# Patient Record
Sex: Male | Born: 1960 | Race: White | Hispanic: No | Marital: Single | Smoking: Former smoker
Health system: Southern US, Community
[De-identification: ages and names within clinical notes are randomized; demographics above are authoritative.]

## PROBLEM LIST (undated history)

## (undated) DIAGNOSIS — T8859XA Other complications of anesthesia, initial encounter: Secondary | ICD-10-CM

## (undated) DIAGNOSIS — Z9889 Other specified postprocedural states: Secondary | ICD-10-CM

## (undated) DIAGNOSIS — N200 Calculus of kidney: Secondary | ICD-10-CM

## (undated) DIAGNOSIS — Z87442 Personal history of urinary calculi: Secondary | ICD-10-CM

## (undated) DIAGNOSIS — J189 Pneumonia, unspecified organism: Secondary | ICD-10-CM

## (undated) DIAGNOSIS — C801 Malignant (primary) neoplasm, unspecified: Secondary | ICD-10-CM

## (undated) HISTORY — PX: TENDON REPAIR: SHX5111

## (undated) HISTORY — PX: COLONOSCOPY W/ POLYPECTOMY: SHX1380

## (undated) HISTORY — PX: CYST EXCISION: SHX5701

## (undated) HISTORY — DX: Calculus of kidney: N20.0

## (undated) HISTORY — PX: VASECTOMY: SHX75

---

## 2007-04-13 ENCOUNTER — Ambulatory Visit: Payer: Self-pay | Admitting: Family Medicine

## 2007-05-19 ENCOUNTER — Ambulatory Visit: Payer: Self-pay | Admitting: General Surgery

## 2010-01-16 ENCOUNTER — Ambulatory Visit: Payer: Self-pay | Admitting: Family Medicine

## 2013-01-19 DIAGNOSIS — E7800 Pure hypercholesterolemia, unspecified: Secondary | ICD-10-CM | POA: Insufficient documentation

## 2013-01-19 DIAGNOSIS — Z8249 Family history of ischemic heart disease and other diseases of the circulatory system: Secondary | ICD-10-CM | POA: Insufficient documentation

## 2013-01-19 DIAGNOSIS — Z8042 Family history of malignant neoplasm of prostate: Secondary | ICD-10-CM | POA: Insufficient documentation

## 2013-01-19 DIAGNOSIS — L409 Psoriasis, unspecified: Secondary | ICD-10-CM | POA: Insufficient documentation

## 2014-01-19 DIAGNOSIS — N400 Enlarged prostate without lower urinary tract symptoms: Secondary | ICD-10-CM | POA: Insufficient documentation

## 2015-04-04 ENCOUNTER — Other Ambulatory Visit (HOSPITAL_COMMUNITY): Payer: Self-pay | Admitting: Physical Medicine and Rehabilitation

## 2015-04-04 DIAGNOSIS — M545 Low back pain: Secondary | ICD-10-CM

## 2015-04-04 DIAGNOSIS — M5417 Radiculopathy, lumbosacral region: Secondary | ICD-10-CM

## 2015-04-04 DIAGNOSIS — M5116 Intervertebral disc disorders with radiculopathy, lumbar region: Secondary | ICD-10-CM

## 2015-04-11 ENCOUNTER — Ambulatory Visit
Admission: RE | Admit: 2015-04-11 | Discharge: 2015-04-11 | Disposition: A | Discharge status: Home / Self Care | Source: Ambulatory Visit | Attending: Physical Medicine and Rehabilitation | Admitting: Physical Medicine and Rehabilitation

## 2015-04-11 ENCOUNTER — Other Ambulatory Visit: Payer: Self-pay | Admitting: Physical Medicine and Rehabilitation

## 2015-04-11 ENCOUNTER — Ambulatory Visit (HOSPITAL_COMMUNITY): Payer: Self-pay

## 2015-04-11 DIAGNOSIS — M5126 Other intervertebral disc displacement, lumbar region: Secondary | ICD-10-CM | POA: Insufficient documentation

## 2015-04-11 DIAGNOSIS — M5116 Intervertebral disc disorders with radiculopathy, lumbar region: Secondary | ICD-10-CM

## 2015-04-11 DIAGNOSIS — M5417 Radiculopathy, lumbosacral region: Secondary | ICD-10-CM

## 2015-04-11 DIAGNOSIS — M545 Low back pain: Secondary | ICD-10-CM

## 2015-04-11 DIAGNOSIS — M2578 Osteophyte, vertebrae: Secondary | ICD-10-CM | POA: Insufficient documentation

## 2015-04-11 DIAGNOSIS — M5416 Radiculopathy, lumbar region: Secondary | ICD-10-CM | POA: Diagnosis present

## 2017-03-12 DIAGNOSIS — G8929 Other chronic pain: Secondary | ICD-10-CM | POA: Insufficient documentation

## 2017-07-14 DIAGNOSIS — Z87442 Personal history of urinary calculi: Secondary | ICD-10-CM | POA: Insufficient documentation

## 2017-08-01 DIAGNOSIS — E538 Deficiency of other specified B group vitamins: Secondary | ICD-10-CM | POA: Insufficient documentation

## 2018-05-19 ENCOUNTER — Encounter: Payer: Self-pay | Admitting: Urology

## 2018-05-19 ENCOUNTER — Ambulatory Visit (INDEPENDENT_AMBULATORY_CARE_PROVIDER_SITE_OTHER): Admitting: Urology

## 2018-05-19 VITALS — BP 103/62 | HR 87 | Ht 67.75 in | Wt 165.8 lb

## 2018-05-19 DIAGNOSIS — N200 Calculus of kidney: Secondary | ICD-10-CM | POA: Diagnosis not present

## 2018-05-19 DIAGNOSIS — R1012 Left upper quadrant pain: Secondary | ICD-10-CM | POA: Diagnosis not present

## 2018-05-19 LAB — URINALYSIS, COMPLETE
BILIRUBIN UA: NEGATIVE
Glucose, UA: NEGATIVE
Ketones, UA: NEGATIVE
Leukocytes, UA: NEGATIVE
Nitrite, UA: NEGATIVE
PH UA: 5.5 (ref 5.0–7.5)
Protein, UA: NEGATIVE
RBC, UA: NEGATIVE
Specific Gravity, UA: 1.025 (ref 1.005–1.030)
UUROB: 0.2 mg/dL (ref 0.2–1.0)

## 2018-05-19 LAB — MICROSCOPIC EXAMINATION
EPITHELIAL CELLS (NON RENAL): NONE SEEN /HPF (ref 0–10)
WBC, UA: NONE SEEN /hpf (ref 0–5)

## 2018-05-19 NOTE — Progress Notes (Addendum)
   05/19/2018 12:18 PM   Gerald Chambers [birth year] [id number]  Referring provider: Gayland Curry, MD [address] [place], [place] [address]  CC: Kidney stones  HPI: I had the pleasure of seeing Mr. Lohr in urology clinic today in consultation for kidney stones from Dr. Astrid Divine.  He is a 57 year old male who had 3 to 4 months of flank pain and ultimately resulting in passing a approximately 1 cm stone in 02-Sep-2017.  He has passed numerous small fragments since that time.  He has never had stones or flank pain prior to this first episode in 09/02/17.  He denies a family history of kidney stones or prostate cancer.  He does report ongoing left-sided flank pain he feels may be related to a stone.  He has never had any cross-sectional imaging or surgical interventions for stones.  He has mild lower urinary tract symptoms well managed on doxazosin.  He denies a diet high in oxalate Icenhour foods. He denies fevers or chills.  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 Kidney stone      Allergies: No Known Allergies  Family History: Family History  Problem Relation Age of Onset  . Prostate cancer Father   . Bladder Cancer Neg Hx   . Kidney cancer Neg Hx     Social History:  reports that he has quit smoking. He has never used smokeless tobacco. He reports that he drank alcohol. He reports that he does not use drugs.  ROS: Please see flowsheet from today's date for complete review of systems.  Physical Exam: BP 103/62 (BP Location: Left Arm, Patient Position: Sitting, Cuff Size: Normal)   Pulse 87   Ht 5' 7.75" (1.721 m)   Wt 165 lb 12.8 oz (75.2 kg)   BMI 25.40 kg/m    Constitutional:  Alert and oriented, No acute distress. Cardiovascular: No clubbing, cyanosis, or edema. Respiratory: Normal respiratory effort, no increased work of breathing. GI: Abdomen is  soft, nontender, nondistended, no abdominal masses GU: Left CVA tenderness Lymph: No cervical or inguinal lymphadenopathy. Skin: No rashes, bruises or suspicious lesions. Neurologic: Grossly intact, no focal deficits, moving all 4 extremities. Psychiatric: Normal mood and affect.  Pertinent Imaging: None to review  Assessment & Plan:   In summary, Mr. Doughten is a 57 year old male who passed a very large nearly 1 cm stone in 09-02-2017, and has passed numerous small fragments since that time.  He is never had any cross-sectional imaging previously.  He does report some chronic left-sided flank pain worrisome for residual stones.   We discussed general stone prevention strategies including adequate hydration with goal of producing 2.5 L of urine daily, increasing citric acid intake, increasing calcium intake during high oxalate meals, minimizing animal protein, and decreasing salt intake. Information about dietary recommendations given today.   1.  CT stone protocol to evaluate stone burden, call with results 2.  24-hour urine collection   Billey Co, [place] [address], [place],  [address] [phone]

## 2018-05-27 ENCOUNTER — Ambulatory Visit
Admission: RE | Admit: 2018-05-27 | Discharge: 2018-05-27 | Disposition: A | Discharge status: Home / Self Care | Source: Ambulatory Visit | Attending: Urology | Admitting: Urology

## 2018-05-27 ENCOUNTER — Other Ambulatory Visit: Payer: Self-pay | Admitting: Urology

## 2018-05-27 DIAGNOSIS — M5137 Other intervertebral disc degeneration, lumbosacral region: Secondary | ICD-10-CM | POA: Diagnosis not present

## 2018-05-27 DIAGNOSIS — N200 Calculus of kidney: Secondary | ICD-10-CM | POA: Insufficient documentation

## 2018-05-27 DIAGNOSIS — R1012 Left upper quadrant pain: Secondary | ICD-10-CM

## 2018-05-28 ENCOUNTER — Telehealth: Payer: Self-pay | Admitting: Urology

## 2018-05-28 NOTE — Telephone Encounter (Signed)
Pt returned your call, you were in a room with another pt. Please call patient back at [phone]. Thank you.

## 2018-05-28 NOTE — Telephone Encounter (Signed)
-----   Message from Billey Co, MD sent at 05/28/2018 12:28 PM EDT ----- Regarding: follow up Please schedule follow up in 5-6 weeks to discuss 24 hr urine results and CT urogram.  Billey Co, MD 05/28/2018

## 2018-05-28 NOTE — Addendum Note (Signed)
Addended by: Billey Co on: 05/28/2018 12:28 PM   Modules accepted: Orders

## 2018-05-28 NOTE — Telephone Encounter (Signed)
Spoke with Dr. Diamantina Providence and he said it was ok for the patient to have CT and he would call with results. Transferred patient over to the scheduling dept. To schedule today. Patient stated that he is going to start his 24 hour UA on Monday and come in on Tuesday to do his labs and I advised him to make sure he made his results appt that day for about 3-4 weeks.   Sharyn Lull

## 2018-06-02 ENCOUNTER — Other Ambulatory Visit

## 2018-06-05 ENCOUNTER — Ambulatory Visit
Admission: RE | Admit: 2018-06-05 | Discharge: 2018-06-05 | Disposition: A | Discharge status: Home / Self Care | Source: Ambulatory Visit | Attending: Urology | Admitting: Urology

## 2018-06-05 DIAGNOSIS — K469 Unspecified abdominal hernia without obstruction or gangrene: Secondary | ICD-10-CM | POA: Insufficient documentation

## 2018-06-05 DIAGNOSIS — R1012 Left upper quadrant pain: Secondary | ICD-10-CM | POA: Insufficient documentation

## 2018-06-05 DIAGNOSIS — N200 Calculus of kidney: Secondary | ICD-10-CM | POA: Diagnosis not present

## 2018-06-05 MED ORDER — IOPAMIDOL (ISOVUE-300) INJECTION 61%
125.0000 mL | Freq: Once | INTRAVENOUS | Status: AC | PRN
  Administered 2018-06-05: 125 mL via INTRAVENOUS

## 2018-06-09 ENCOUNTER — Other Ambulatory Visit: Payer: Self-pay | Admitting: Urology

## 2018-06-09 ENCOUNTER — Telehealth: Payer: Self-pay

## 2018-06-09 NOTE — Telephone Encounter (Signed)
-----   Message from Billey Co, MD sent at 06/08/2018  4:28 PM EDT ----- Regarding: normal CT Please let him know his CT scan with contrast was normal.  He had some simple cysts in the kidneys that do not need further follow-up.  His next appointment should be with me in a month to discuss 24-hour urine results.  Nickolas Madrid, MD 06/08/2018    ----- Message ----- From: Interface, Rad Results In Sent: 06/05/2018   3:26 PM EDT To: Billey Co, MD

## 2018-06-09 NOTE — Telephone Encounter (Signed)
Called pt, male answers, and proceeds to hang up phone. Sent pt my chart message.

## 2018-06-18 ENCOUNTER — Telehealth: Payer: Self-pay | Admitting: Urology

## 2018-06-18 NOTE — Telephone Encounter (Signed)
-----   Message from Billey Co, MD sent at 06/17/2018  3:30 PM EDT ----- Regarding: follow up Please set up follow up w/me next available to discuss 24 hr urine results.  Thanks Nickolas Madrid, MD 06/17/2018

## 2018-06-18 NOTE — Telephone Encounter (Signed)
Made app and lm on vm with details and for patient to cb to confirm   [facility]

## 2018-06-25 ENCOUNTER — Other Ambulatory Visit: Payer: Self-pay

## 2018-06-25 ENCOUNTER — Ambulatory Visit (INDEPENDENT_AMBULATORY_CARE_PROVIDER_SITE_OTHER): Admitting: Urology

## 2018-06-25 ENCOUNTER — Encounter: Payer: Self-pay | Admitting: Urology

## 2018-06-25 VITALS — BP 106/67 | HR 83 | Ht 68.0 in | Wt 164.0 lb

## 2018-06-25 DIAGNOSIS — N200 Calculus of kidney: Secondary | ICD-10-CM

## 2018-06-25 NOTE — Progress Notes (Signed)
   06/25/2018 3:38 PM   Gerald Chambers [birth year] [id number]  Reason for visit: Follow up discuss 24 hour urine  HPI: I saw Gerald Chambers back in urology clinic to discuss the results of his 24-hour urine, CT scan, and stone analysis.  He has an extensive history of stone disease, he reports passing 10 to 12 stones per year.  CT scan May 27, 2018 showed several small right renal calculi with no evidence of obstruction bilaterally.  Stone analysis was calcium oxalate.  He denies any stone episodes since he was last seen.  He is here to discuss the above results.   ROS: Please see flowsheet from today's date for complete review of systems.  Physical Exam: BP 106/67   Pulse 83   Ht 5\' 8"  (1.727 m)   Wt 164 lb (74.4 kg)   BMI 24.94 kg/m    Constitutional:  Alert and oriented, No acute distress. Respiratory: Normal respiratory effort, no increased work of breathing. GI: Abdomen is soft, nontender, nondistended, no abdominal masses GU: No CVA tenderness DRE: Deferred Skin: No rashes, bruises or suspicious lesions. Neurologic: Grossly intact, no focal deficits, moving all 4 extremities. Psychiatric: Normal mood and affect  Laboratory Data: 24-hour urine results reviewed  Notable for low volume of 1.9 L, and low citrate of 366   Assessment & Plan:   In summary, Gerald Chambers is a 57 year old male with a long history of recurrent stone disease.  His stone analysis shows calcium oxalate stones, and 24-hour urine results notable for low urine output, and low citrate.  I I strongly encouraged him to increase his fluid intake to aim for goal of 2.5L of urine output per day.  We also discussed the importance of adding citrate containing foods like fruits and vegetables, lemonade, and citrus juices to the diet.  I also recommended potassium citrate supplementation, however he is not willing to take additional medications at this time.  Return in about 6 months (around 12/25/2018) for  KUB.  Billey Co, [facility] [address], [place], [place] [address] [phone]

## 2018-12-31 ENCOUNTER — Ambulatory Visit: Admitting: Urology

## 2019-03-22 ENCOUNTER — Encounter: Payer: Self-pay | Admitting: Urology

## 2019-03-22 ENCOUNTER — Ambulatory Visit (INDEPENDENT_AMBULATORY_CARE_PROVIDER_SITE_OTHER): Admitting: Urology

## 2019-03-22 ENCOUNTER — Ambulatory Visit
Admission: RE | Admit: 2019-03-22 | Discharge: 2019-03-22 | Disposition: A | Discharge status: Home / Self Care | Source: Ambulatory Visit | Attending: Urology | Admitting: Urology

## 2019-03-22 ENCOUNTER — Other Ambulatory Visit: Payer: Self-pay

## 2019-03-22 VITALS — BP 106/72 | HR 72 | Ht 68.0 in | Wt 175.0 lb

## 2019-03-22 DIAGNOSIS — N200 Calculus of kidney: Secondary | ICD-10-CM | POA: Diagnosis not present

## 2019-03-22 NOTE — Progress Notes (Signed)
   03/22/2019 2:58 PM   Gerald Chambers 09-27-1960 [id number]  Reason for visit: Follow up recurrent nephrolithiasis  HPI: I saw Gerald Chambers back in urology clinic today for follow-up of extensive nephrolithiasis.  He typically passes 10-12 calcium oxalate stones per year.  We did a 24-hour urine work-up in the fall 2019 which showed low urine volume of 1.9 L, and low citrate of 366.  He was resistant to starting potassium citrate at that time secondary to cost and GI side effects.  He reports he has passed 3 small stones in the 6 months since we saw him last.  These were visible on his prior CT, and were 2 to 3 mm in size each.  Overall he feels he is doing well, and is decreased his typical stone events over 79-month period.  KUB today does not show any definitive nephrolithiasis.  ROS: Please see flowsheet from today's date for complete review of systems.  Physical Exam: BP 106/72 (BP Location: Left Arm, Patient Position: Sitting)   Pulse 72   Ht 5\' 8"  (1.727 m)   Wt 175 lb (79.4 kg)   BMI 26.61 kg/m    Constitutional:  Alert and oriented, No acute distress. Respiratory: Normal respiratory effort, no increased work of breathing. GI: Abdomen is soft, nontender, nondistended, no abdominal masses GU: No CVA tenderness Skin: No rashes, bruises or suspicious lesions. Neurologic: Grossly intact, no focal deficits, moving all 4 extremities. Psychiatric: Normal mood and affect  Pertinent Imaging: I have personally reviewed the KUB, no evidence of stones on today's film.  Assessment & Plan:   In summary, the patient is a 58 year old male with recurrent calcium oxalate nephrolithiasis.  He has decreased his stone frequency with increased fluid intake and adding citrate from lemonade to the diet.  We discussed stone prevention strategies at length again today including goal urine output of 2.5 L/day, minimizing salt in the diet, low protein diet, and taking in calcium with any high oxalate  meals.  He is amenable to trying Litholyte for his low citrate and recurrent calcium oxalate stones.  Samples were provided today.  Trial of litholyte 10 mEq twice daily RTC 1 year for KUB  A total of 15 minutes were spent face-to-face with the patient, greater than 50% was spent in patient education, counseling, and coordination of care regarding nephrolithiasis and stone prevention.   Billey Co, [facility] [address] [place], [place] [address] [phone]

## 2019-03-22 NOTE — Patient Instructions (Signed)
Dietary Guidelines to Help Prevent Kidney Stones Kidney stones are deposits of minerals and salts that form inside your kidneys. Your risk of developing kidney stones may be greater depending on your diet, your lifestyle, the medicines you take, and whether you have certain medical conditions. Most people can reduce their chances of developing kidney stones by following the instructions below. Depending on your overall health and the type of kidney stones you tend to develop, your dietitian may give you more specific instructions. What are tips for following this plan? Reading food labels  Choose foods with "no salt added" or "low-salt" labels. Limit your sodium intake to less than 1500 mg per day.  Choose foods with calcium for each meal and snack. Try to eat about 300 mg of calcium at each meal. Foods that contain 200-500 mg of calcium per serving include: ? 8 oz (237 ml) of milk, fortified nondairy milk, and fortified fruit juice. ? 8 oz (237 ml) of kefir, yogurt, and soy yogurt. ? 4 oz (118 ml) of tofu. ? 1 oz of cheese. ? 1 cup (300 g) of dried figs. ? 1 cup (91 g) of cooked broccoli. ? 1-3 oz can of sardines or mackerel.  Most people need 1000 to 1500 mg of calcium each day. Talk to your dietitian about how much calcium is recommended for you. Shopping  Buy plenty of fresh fruits and vegetables. Most people do not need to avoid fruits and vegetables, even if they contain nutrients that may contribute to kidney stones.  When shopping for convenience foods, choose: ? Whole pieces of fruit. ? Premade salads with dressing on the side. ? Low-fat fruit and yogurt smoothies.  Avoid buying frozen meals or prepared deli foods.  Look for foods with live cultures, such as yogurt and kefir. Cooking  Do not add salt to food when cooking. Place a salt shaker on the table and allow each person to add his or her own salt to taste.  Use vegetable protein, such as beans, textured vegetable  protein (TVP), or tofu instead of meat in pasta, casseroles, and soups. Meal planning   Eat less salt, if told by your dietitian. To do this: ? Avoid eating processed or premade food. ? Avoid eating fast food.  Eat less animal protein, including cheese, meat, poultry, or fish, if told by your dietitian. To do this: ? Limit the number of times you have meat, poultry, fish, or cheese each week. Eat a diet free of meat at least 2 days a week. ? Eat only one serving each day of meat, poultry, fish, or seafood. ? When you prepare animal protein, cut pieces into small portion sizes. For most meat and fish, one serving is about the size of one deck of cards.  Eat at least 5 servings of fresh fruits and vegetables each day. To do this: ? Keep fruits and vegetables on hand for snacks. ? Eat 1 piece of fruit or a handful of berries with breakfast. ? Have a salad and fruit at lunch. ? Have two kinds of vegetables at dinner.  Limit foods that are high in a substance called oxalate. These include: ? Spinach. ? Rhubarb. ? Beets. ? Potato chips and french fries. ? Nuts.  If you regularly take a diuretic medicine, make sure to eat at least 1-2 fruits or vegetables high in potassium each day. These include: ? Avocado. ? Banana. ? Orange, prune, carrot, or tomato juice. ? Baked potato. ? Cabbage. ? Beans and split   peas. General instructions   Drink enough fluid to keep your urine clear or pale yellow. This is the most important thing you can do.  Talk to your health care provider and dietitian about taking daily supplements. Depending on your health and the cause of your kidney stones, you may be advised: ? Not to take supplements with vitamin C. ? To take a calcium supplement. ? To take a daily probiotic supplement. ? To take other supplements such as magnesium, fish oil, or vitamin B6.  Take all medicines and supplements as told by your health care provider.  Limit alcohol intake to no  more than 1 drink a day for nonpregnant women and 2 drinks a day for men. One drink equals 12 oz of beer, 5 oz of wine, or 1 oz of hard liquor.  Lose weight if told by your health care provider. Work with your dietitian to find strategies and an eating plan that works best for you. What foods are not recommended? Limit your intake of the following foods, or as told by your dietitian. Talk to your dietitian about specific foods you should avoid based on the type of kidney stones and your overall health. Grains Breads. Bagels. Rolls. Baked goods. Salted crackers. Cereal. Pasta. Vegetables Spinach. Rhubarb. Beets. Canned vegetables. Angie Fava. Olives. Meats and other protein foods Nuts. Nut butters. Large portions of meat, poultry, or fish. Salted or cured meats. Deli meats. Hot dogs. Sausages. Dairy Cheese. Beverages Regular soft drinks. Regular vegetable juice. Seasonings and other foods Seasoning blends with salt. Salad dressings. Canned soups. Soy sauce. Ketchup. Barbecue sauce. Canned pasta sauce. Casseroles. Pizza. Lasagna. Frozen meals. Potato chips. [place] fries. Summary  You can reduce your risk of kidney stones by making changes to your diet.  The most important thing you can do is drink enough fluid. You should drink enough fluid to keep your urine clear or pale yellow.  Ask your health care provider or dietitian how much protein from animal sources you should eat each day, and also how much salt and calcium you should have each day. This information is not intended to replace advice given to you by your health care provider. Make sure you discuss any questions you have with your health care provider. Document Released: 12/28/2010 Document Revised: 12/23/2018 Document Reviewed: 08/13/2016 Elsevier Patient Education  2020 [employer].

## 2020-03-23 ENCOUNTER — Other Ambulatory Visit: Payer: Self-pay

## 2020-03-23 ENCOUNTER — Ambulatory Visit (INDEPENDENT_AMBULATORY_CARE_PROVIDER_SITE_OTHER): Admitting: Urology

## 2020-03-23 ENCOUNTER — Ambulatory Visit
Admission: RE | Admit: 2020-03-23 | Discharge: 2020-03-23 | Disposition: A | Discharge status: Home / Self Care | Source: Ambulatory Visit | Attending: Urology | Admitting: Urology

## 2020-03-23 ENCOUNTER — Encounter: Payer: Self-pay | Admitting: Urology

## 2020-03-23 ENCOUNTER — Ambulatory Visit
Admission: RE | Admit: 2020-03-23 | Discharge: 2020-03-23 | Disposition: A | Discharge status: Home / Self Care | Attending: Urology | Admitting: Urology

## 2020-03-23 VITALS — BP 122/85 | HR 68 | Ht 67.0 in | Wt 170.0 lb

## 2020-03-23 DIAGNOSIS — N200 Calculus of kidney: Secondary | ICD-10-CM

## 2020-03-23 NOTE — Progress Notes (Signed)
° °  03/23/2020 3:09 PM   [name] 1960/10/28 [id number]  Reason for visit: Follow up nephrolithiasis, BPH  HPI: I saw Mr. Hirota in urology clinic for follow-up of nephrolithiasis.  Unfortunately, his mother recently passed away who he had been the primary caretaker for for over 10 years.  He has a long history of recurrent calcium oxalate stones.  We did a 24-hour urine work-up in the fall 2019 which showed a low urine volume of 1.9 L, low urine citrate of 366.  He did not want to start potassium citrate secondary to cost and potential side effects.  He was having 10 to 12 stone episodes per year at that time.  He was amenable to starting litholyte 1 packet daily, which is significantly reduces stone events.  He reports he has had only 1 to 2 stones in the last year, and these all passed spontaneously.    I personally reviewed the KUB today which shows only a punctate stone in the right lower pole.  We discussed general stone prevention strategies including adequate hydration with goal of producing 2.5 L of urine daily, increasing citric acid intake, increasing calcium intake during high oxalate meals, minimizing animal protein, and decreasing salt intake. Information about dietary recommendations given today.   He also mentioned today that he has had some weakening urinary stream despite being on doxazosin daily, I offered to increase this dose, however he would like to wait until he sees his PCP and his current prescription runs out.  Continue litholyte packets daily for stone prevention RTC 1 year with [place], MD  [place] [address], [place], [place] [address] [phone]

## 2020-03-23 NOTE — Patient Instructions (Signed)
Dietary Guidelines to Help Prevent Kidney Stones Kidney stones are deposits of minerals and salts that form inside your kidneys. Your risk of developing kidney stones may be greater depending on your diet, your lifestyle, the medicines you take, and whether you have certain medical conditions. Most people can reduce their chances of developing kidney stones by following the instructions below. Depending on your overall health and the type of kidney stones you tend to develop, your dietitian may give you more specific instructions. What are tips for following this plan? Reading food labels  Choose foods with "no salt added" or "low-salt" labels. Limit your sodium intake to less than 1500 mg per day.  Choose foods with calcium for each meal and snack. Try to eat about 300 mg of calcium at each meal. Foods that contain 200-500 mg of calcium per serving include: ? 8 oz (237 ml) of milk, fortified nondairy milk, and fortified fruit juice. ? 8 oz (237 ml) of kefir, yogurt, and soy yogurt. ? 4 oz (118 ml) of tofu. ? 1 oz of cheese. ? 1 cup (300 g) of dried figs. ? 1 cup (91 g) of cooked broccoli. ? 1-3 oz can of sardines or mackerel.  Most people need 1000 to 1500 mg of calcium each day. Talk to your dietitian about how much calcium is recommended for you. Shopping  Buy plenty of fresh fruits and vegetables. Most people do not need to avoid fruits and vegetables, even if they contain nutrients that may contribute to kidney stones.  When shopping for convenience foods, choose: ? Whole pieces of fruit. ? Premade salads with dressing on the side. ? Low-fat fruit and yogurt smoothies.  Avoid buying frozen meals or prepared deli foods.  Look for foods with live cultures, such as yogurt and kefir. Cooking  Do not add salt to food when cooking. Place a salt shaker on the table and allow each person to add his or her own salt to taste.  Use vegetable protein, such as beans, textured vegetable  protein (TVP), or tofu instead of meat in pasta, casseroles, and soups. Meal planning   Eat less salt, if told by your dietitian. To do this: ? Avoid eating processed or premade food. ? Avoid eating fast food.  Eat less animal protein, including cheese, meat, poultry, or fish, if told by your dietitian. To do this: ? Limit the number of times you have meat, poultry, fish, or cheese each week. Eat a diet free of meat at least 2 days a week. ? Eat only one serving each day of meat, poultry, fish, or seafood. ? When you prepare animal protein, cut pieces into small portion sizes. For most meat and fish, one serving is about the size of one deck of cards.  Eat at least 5 servings of fresh fruits and vegetables each day. To do this: ? Keep fruits and vegetables on hand for snacks. ? Eat 1 piece of fruit or a handful of berries with breakfast. ? Have a salad and fruit at lunch. ? Have two kinds of vegetables at dinner.  Limit foods that are high in a substance called oxalate. These include: ? Spinach. ? Rhubarb. ? Beets. ? Potato chips and french fries. ? Nuts.  If you regularly take a diuretic medicine, make sure to eat at least 1-2 fruits or vegetables high in potassium each day. These include: ? Avocado. ? Banana. ? Orange, prune, carrot, or tomato juice. ? Baked potato. ? Cabbage. ? Beans and split   peas. General instructions   Drink enough fluid to keep your urine clear or pale yellow. This is the most important thing you can do.  Talk to your health care provider and dietitian about taking daily supplements. Depending on your health and the cause of your kidney stones, you may be advised: ? Not to take supplements with vitamin C. ? To take a calcium supplement. ? To take a daily probiotic supplement. ? To take other supplements such as magnesium, fish oil, or vitamin B6.  Take all medicines and supplements as told by your health care provider.  Limit alcohol intake to no  more than 1 drink a day for nonpregnant women and 2 drinks a day for men. One drink equals 12 oz of beer, 5 oz of wine, or 1 oz of hard liquor.  Lose weight if told by your health care provider. Work with your dietitian to find strategies and an eating plan that works best for you. What foods are not recommended? Limit your intake of the following foods, or as told by your dietitian. Talk to your dietitian about specific foods you should avoid based on the type of kidney stones and your overall health. Grains Breads. Bagels. Rolls. Baked goods. Salted crackers. Cereal. Pasta. Vegetables Spinach. Rhubarb. Beets. Canned vegetables. Pickles. Olives. Meats and other protein foods Nuts. Nut butters. Large portions of meat, poultry, or fish. Salted or cured meats. Deli meats. Hot dogs. Sausages. Dairy Cheese. Beverages Regular soft drinks. Regular vegetable juice. Seasonings and other foods Seasoning blends with salt. Salad dressings. Canned soups. Soy sauce. Ketchup. Barbecue sauce. Canned pasta sauce. Casseroles. Pizza. Lasagna. Frozen meals. Potato chips. French fries. Summary  You can reduce your risk of kidney stones by making changes to your diet.  The most important thing you can do is drink enough fluid. You should drink enough fluid to keep your urine clear or pale yellow.  Ask your health care provider or dietitian how much protein from animal sources you should eat each day, and also how much salt and calcium you should have each day. This information is not intended to replace advice given to you by your health care provider. Make sure you discuss any questions you have with your health care provider. Document Revised: 12/23/2018 Document Reviewed: 08/13/2016 Elsevier Patient Education  2020 [employer]  

## 2020-07-09 IMAGING — CT CT ABD-PEL WO/W CM
3 of 12 series · 11 of 46 positions shown, 17 images · IV contrast (iopamidol)
Comparison: 05/27/2018

CLINICAL DATA: Flank pain with history of kidney stones.

EXAM:
CT ABDOMEN AND PELVIS WITHOUT AND WITH CONTRAST
TECHNIQUE: Multidetector CT imaging of the abdomen and pelvis was performed
following the standard protocol before and following the bolus
administration of intravenous contrast.
CONTRAST:  125mL 8LKJTU-5HH IOPAMIDOL (8LKJTU-5HH) INJECTION 61%

[Series 2: without pre · axial · non-contrast · 0.72mm/px · z∈[-1654,-1339]mm · 5 of 95 slices shown, 10 images]
[im 16/95  soft-tissue]
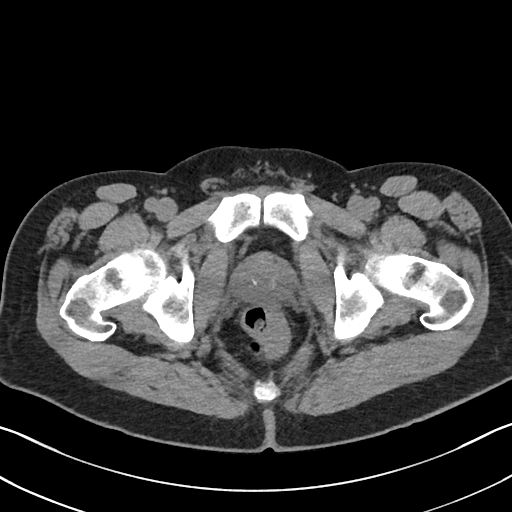
[im 16/95  bone]
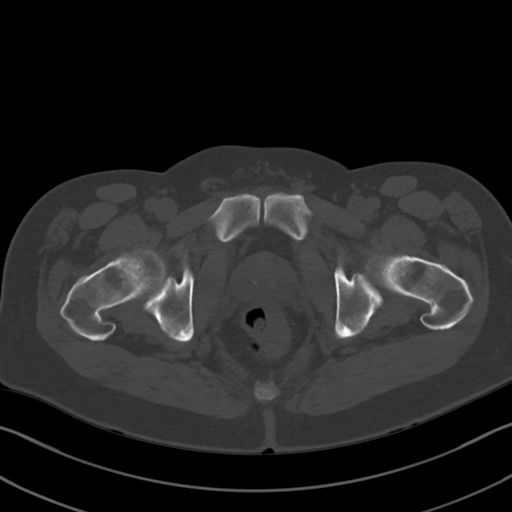
[im 32/95  soft-tissue]
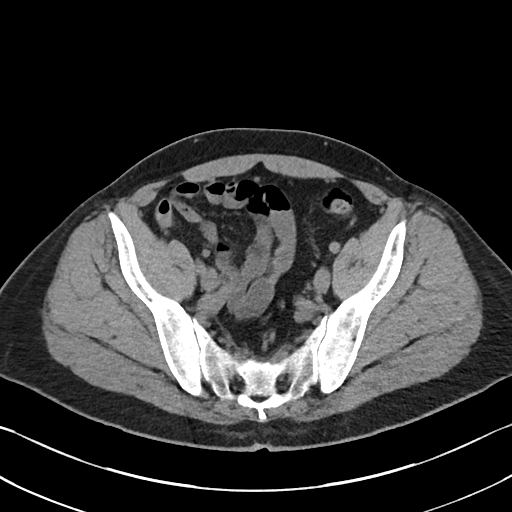
[im 32/95  lung]
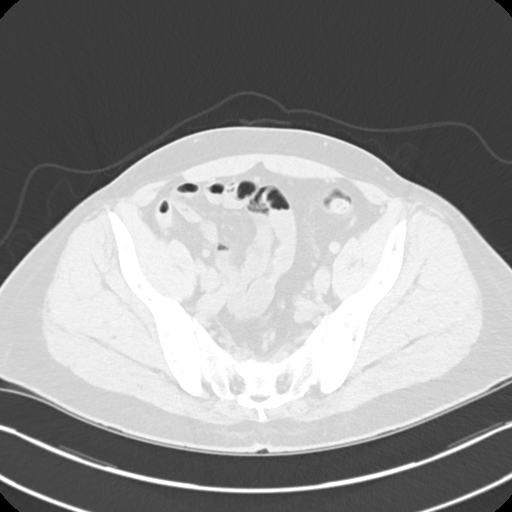
[im 48/95  soft-tissue]
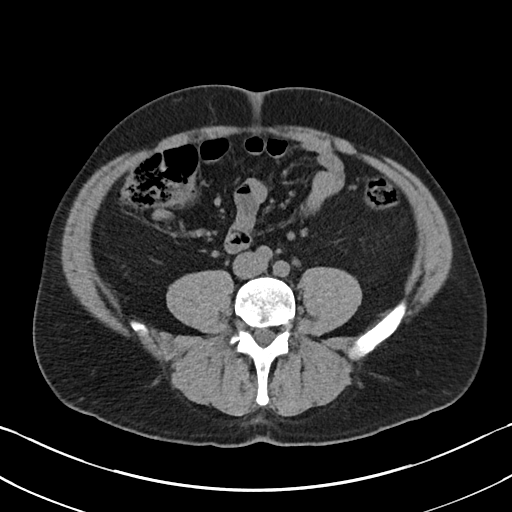
[im 48/95  lung]
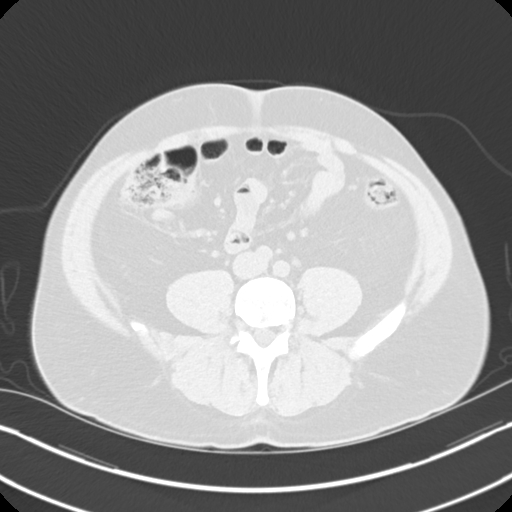
[im 63/95  soft-tissue]
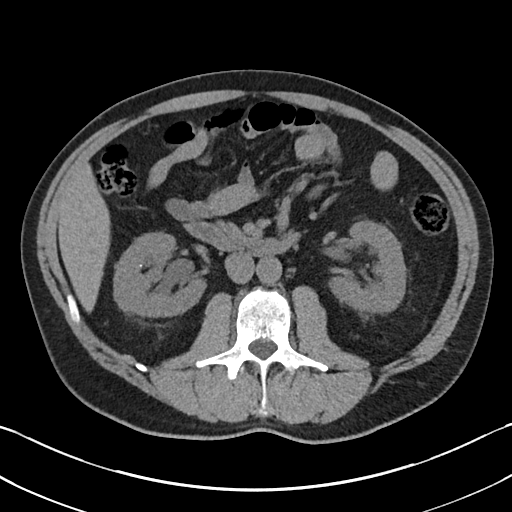
[im 63/95  lung]
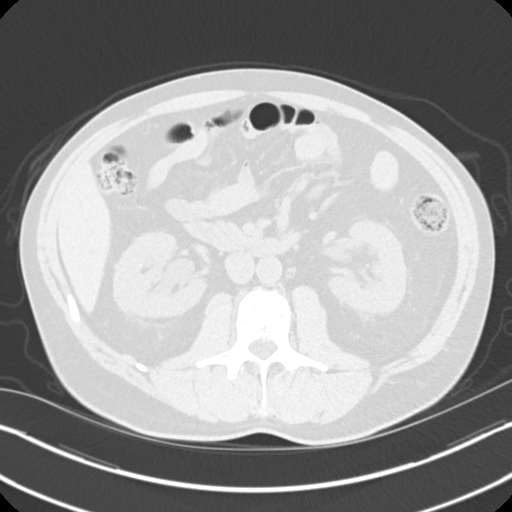
[im 79/95  soft-tissue]
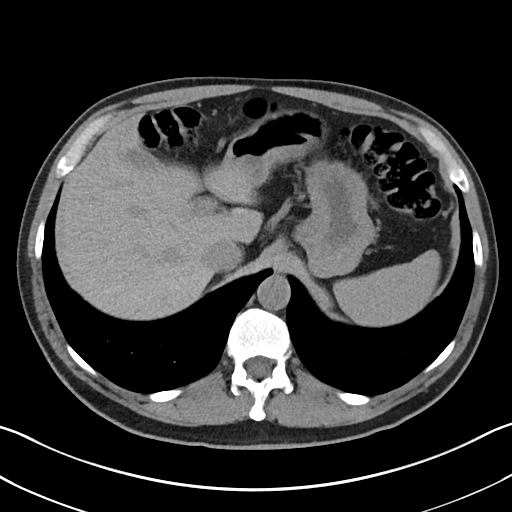
[im 79/95  lung]
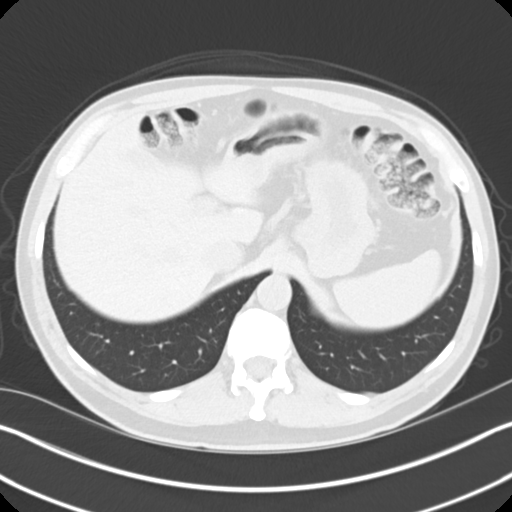

[Series 5: cor without without pre · coronal · non-contrast · 0.72mm/px · 2 of 178 slices shown, 3 images]
[im 60/178  soft-tissue]
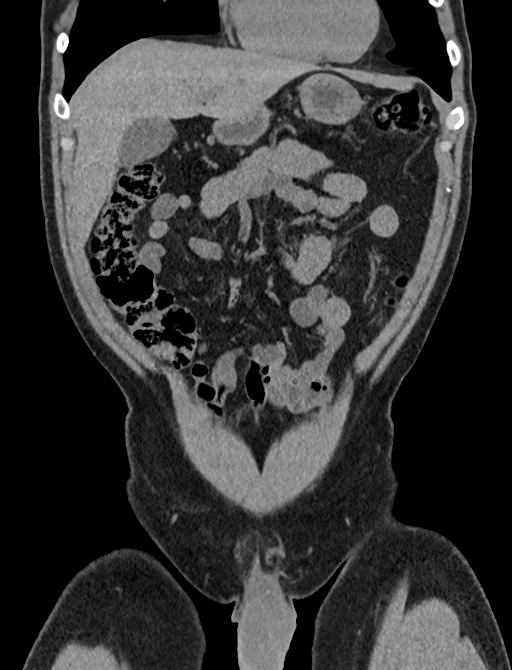
[im 60/178  bone]
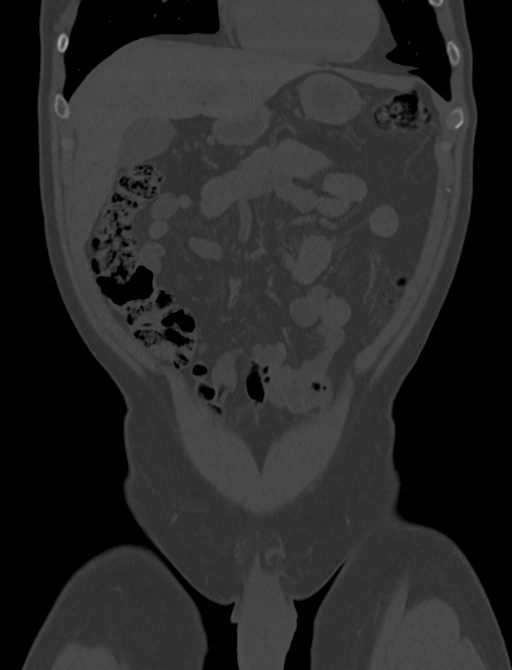
[im 119/178  soft-tissue]
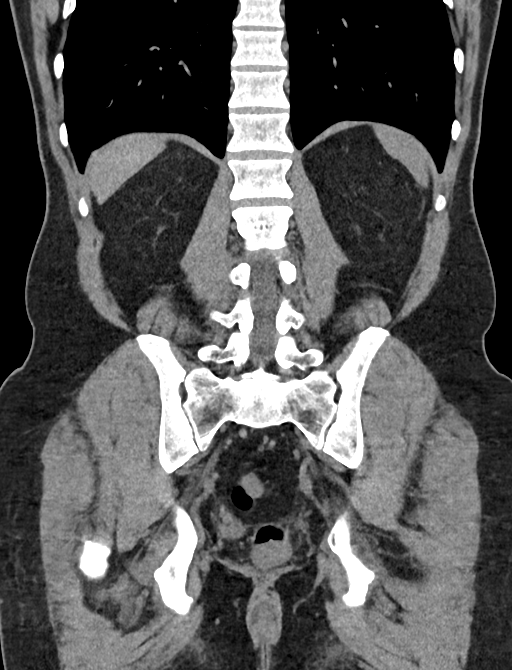

[Series 9: axial with hematuria with · axial · 0.72mm/px · z∈[-1654,-1419]mm · 4 of 95 slices shown]
[im 16/95  soft-tissue]
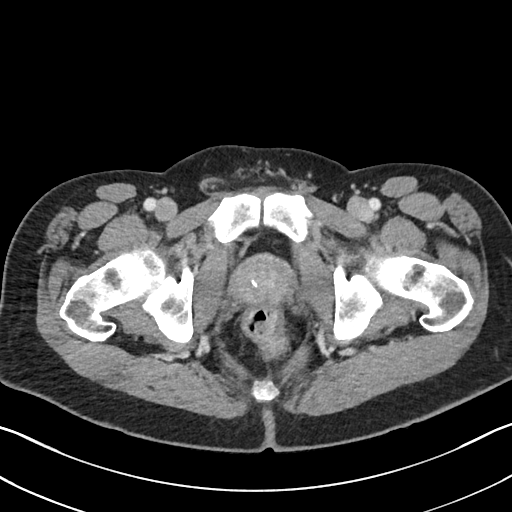
[im 32/95  soft-tissue]
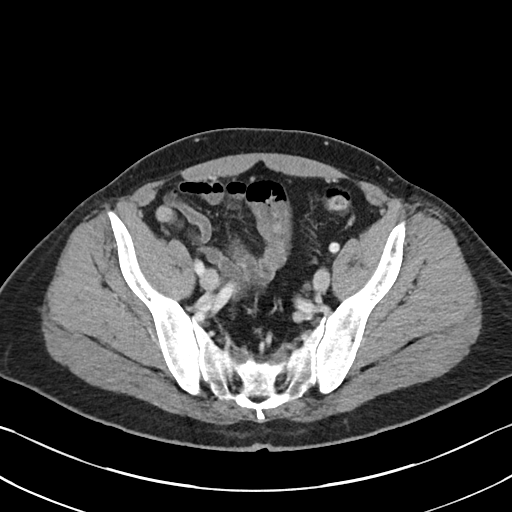
[im 48/95  soft-tissue]
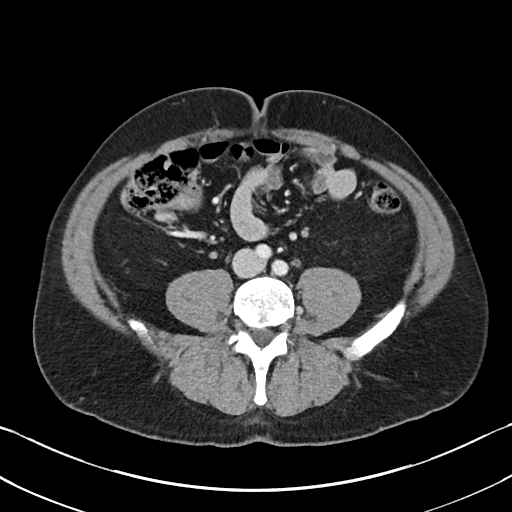
[im 63/95  soft-tissue]
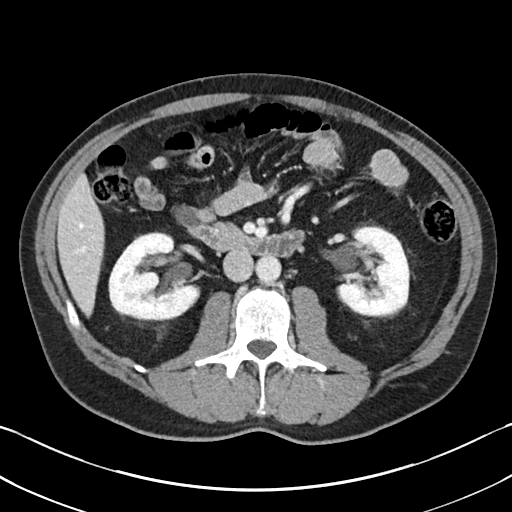

[11 of 46 positions shown; findings below may reference images not displayed]

FINDINGS: Lower chest: Unremarkable.

Hepatobiliary: No focal abnormality within the liver parenchyma.
There is no evidence for gallstones, gallbladder wall thickening, or
pericholecystic fluid. No intrahepatic or extrahepatic biliary
dilation.

Pancreas: No focal mass lesion. No dilatation of the main duct. No
intraparenchymal cyst. No peripancreatic edema.

Spleen: No splenomegaly. No focal mass lesion.

Adrenals/Urinary Tract: No adrenal nodule or mass. 1-2 mm
nonobstructing stones are seen in the upper pole, interpolar region,
and lower pole of the right kidney. No right ureteral stone. No
evidence for stones in the left kidney or ureter. No bladder stones.

Imaging after IV contrast administration reveals no enhancing or
suspicious lesion in either kidney. Central sinus cysts are evident
bilaterally.

Delayed imaging shows no wall thickening or soft tissue filling
defect in either intrarenal collecting system or renal pelvis.
Central sinus cysts generate mass-effect on the collecting system
bilaterally. Left ureter well opacified and has normal imaging
features. Right ureter is un opacified in its mid segment but shows
no focal dilatation, mass lesion, or overt wall thickening.

No focal bladder wall abnormality is evident.

Stomach/Bowel: Stomach is nondistended. No gastric wall thickening.
No evidence of outlet obstruction. Duodenum is normally positioned
as is the ligament of Treitz. No small bowel wall thickening. No
small bowel dilatation. The terminal ileum is normal. The appendix
is normal. No gross colonic mass. No colonic wall thickening. No
substantial diverticular change.

Vascular/Lymphatic: No abdominal aortic aneurysm. No abdominal
aortic atherosclerotic calcification. There is no gastrohepatic or
hepatoduodenal ligament lymphadenopathy. No intraperitoneal or
retroperitoneal lymphadenopathy. No pelvic sidewall lymphadenopathy.

Reproductive: Prostate gland appears mildly enlarged.

Other: No intraperitoneal free fluid.

Musculoskeletal: Small right groin hernia contains only fat. Small
sclerotic foci in the posterior iliac bones bilaterally are
nonspecific. Visualized bony anatomy otherwise unremarkable.
IMPRESSION: 1. 3 tiny nonobstructing stones are seen in the right kidney. No
left or ureteral stones. No bladder stones.
2. Bilateral central sinus cysts.
3. Small right groin hernia contains only fat.

## 2021-03-22 ENCOUNTER — Ambulatory Visit
Admission: RE | Admit: 2021-03-22 | Discharge: 2021-03-22 | Disposition: A | Discharge status: Home / Self Care | Source: Ambulatory Visit | Attending: Urology | Admitting: Urology

## 2021-03-22 ENCOUNTER — Other Ambulatory Visit: Payer: Self-pay

## 2021-03-22 ENCOUNTER — Ambulatory Visit: Payer: Self-pay | Admitting: Urology

## 2021-03-22 ENCOUNTER — Other Ambulatory Visit
Admission: RE | Admit: 2021-03-22 | Discharge: 2021-03-22 | Disposition: A | Discharge status: Home / Self Care | Source: Ambulatory Visit | Attending: Urology | Admitting: Urology

## 2021-03-22 ENCOUNTER — Encounter: Payer: Self-pay | Admitting: Urology

## 2021-03-22 ENCOUNTER — Ambulatory Visit (INDEPENDENT_AMBULATORY_CARE_PROVIDER_SITE_OTHER): Admitting: Urology

## 2021-03-22 VITALS — BP 110/75 | HR 68 | Ht 68.0 in | Wt 165.0 lb

## 2021-03-22 DIAGNOSIS — N138 Other obstructive and reflux uropathy: Secondary | ICD-10-CM

## 2021-03-22 DIAGNOSIS — Z125 Encounter for screening for malignant neoplasm of prostate: Secondary | ICD-10-CM | POA: Diagnosis not present

## 2021-03-22 DIAGNOSIS — N200 Calculus of kidney: Secondary | ICD-10-CM

## 2021-03-22 DIAGNOSIS — N401 Enlarged prostate with lower urinary tract symptoms: Secondary | ICD-10-CM

## 2021-03-22 NOTE — Progress Notes (Signed)
   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  When we first met he was having 10 to 12 stone episodes per year.  A 24-hour urine in fall 2019 showed a low urine volume of 1.9 L, and low urine citrate of 366.  We started him on litholyte packets at that time for citrate supplementation, and he continues to use this daily.  These have significantly decreased his stone episodes to just 1 to 2-year.  He thinks he may have passed a few small stones this year, but none required ER visits.  I personally viewed and interpreted his KUB today that shows a stable punctate stone in the right lower pole.  He has mild BPH symptoms which are well controlled on doxazosin that is prescribed by his PCP.  PSA screening has been normal, and most recent PSA was 0.91 in August 2021.  RTC 1 year KUB prior for stone surveillance Continue doxazosin and PSA screening per PCP   Billey Co, MD  [facility] [address] [place], [place] [address] [phone]

## 2021-03-29 ENCOUNTER — Ambulatory Visit: Payer: Self-pay | Admitting: Urology

## 2021-08-14 ENCOUNTER — Ambulatory Visit
Admission: RE | Admit: 2021-08-14 | Discharge: 2021-08-14 | Disposition: A | Discharge status: Home / Self Care | Attending: Urology | Admitting: Urology

## 2021-08-14 ENCOUNTER — Other Ambulatory Visit: Payer: Self-pay | Admitting: *Deleted

## 2021-08-14 ENCOUNTER — Ambulatory Visit
Admission: RE | Admit: 2021-08-14 | Discharge: 2021-08-14 | Disposition: A | Discharge status: Home / Self Care | Source: Ambulatory Visit | Attending: Urology | Admitting: Urology

## 2021-08-14 ENCOUNTER — Other Ambulatory Visit: Payer: Self-pay

## 2021-08-14 ENCOUNTER — Other Ambulatory Visit
Admission: RE | Admit: 2021-08-14 | Discharge: 2021-08-14 | Disposition: A | Discharge status: Home / Self Care | Source: Home / Self Care | Attending: Urology | Admitting: Urology

## 2021-08-14 ENCOUNTER — Encounter: Payer: Self-pay | Admitting: Urology

## 2021-08-14 ENCOUNTER — Ambulatory Visit (INDEPENDENT_AMBULATORY_CARE_PROVIDER_SITE_OTHER): Admitting: Urology

## 2021-08-14 VITALS — BP 115/78 | HR 92 | Ht 68.0 in | Wt 169.0 lb

## 2021-08-14 DIAGNOSIS — N200 Calculus of kidney: Secondary | ICD-10-CM | POA: Insufficient documentation

## 2021-08-14 DIAGNOSIS — N201 Calculus of ureter: Secondary | ICD-10-CM

## 2021-08-14 LAB — URINALYSIS, COMPLETE (UACMP) WITH MICROSCOPIC
Glucose, UA: NEGATIVE mg/dL
Ketones, ur: 15 mg/dL — AB
Leukocytes,Ua: NEGATIVE
Nitrite: NEGATIVE
Protein, ur: 30 mg/dL — AB
Specific Gravity, Urine: >1.03 — ABNORMAL HIGH (ref 1.005–1.030)
pH: 5.5 (ref 5.0–8.0)

## 2021-08-14 MED ORDER — TAMSULOSIN HCL 0.4 MG PO CAPS: 0.4000 mg | ORAL_CAPSULE | Freq: Every day | ORAL | 0 refills | Status: DC

## 2021-08-14 MED ORDER — ONDANSETRON HCL 4 MG PO TABS: 4.0000 mg | ORAL_TABLET | Freq: Three times a day (TID) | ORAL | 0 refills | Status: DC | PRN

## 2021-08-14 NOTE — Patient Instructions (Signed)

## 2021-08-14 NOTE — Addendum Note (Signed)
Addended by: Kyra Manges on: 08/14/2021 01:29 PM   Modules accepted: Orders

## 2021-08-14 NOTE — Progress Notes (Signed)
   08/14/2021 12:58 PM   Gerald Chambers July 07, 1961 [id number]  Reason for visit: Right flank pain, history of nephrolithiasis  HPI: 60 year old male with recurrent calcium oxalate stones, who has had significant decrease in his stone events since starting litholyte packets.  He was added to clinic today for possible ureteral stone.  He reports 3 days of severe right-sided flank pain and nausea, symptoms feel similar to prior kidney stones.  He denies any fevers or chills.  Urinalysis today with few bacteria, 20-50 RBCs, 6-10 WBCs, 0-5 squamous cells, negative leukocytes, nitrite negative, calcium oxalate crystals present.  I personally viewed and interpreted the KUB from today that suggests a 3 to 4 mm right proximal ureteral stone.  Based on his urinalysis results and KUB, suspect he is passing a 3 to 4 mm right proximal ureteral stone.  We discussed options including medical expulsive therapy, shockwave lithotripsy, or ureteroscopy.  He would like to avoid surgery if at all possible.  He refused a prescription for narcotic, as this makes him severely nauseated.  Return precautions were discussed extensively.  Flomax and Zofran for medical expulsive therapy, NSAIDs encouraged RTC 2 weeks symptom check, sooner if refractory renal colic  Billey Co, [place] [address], [place], [place] [address] [phone]

## 2021-08-28 ENCOUNTER — Telehealth: Payer: Self-pay | Admitting: Urology

## 2021-08-28 ENCOUNTER — Other Ambulatory Visit: Payer: Self-pay | Admitting: Urology

## 2021-08-28 ENCOUNTER — Ambulatory Visit (INDEPENDENT_AMBULATORY_CARE_PROVIDER_SITE_OTHER): Admitting: Urology

## 2021-08-28 ENCOUNTER — Encounter: Payer: Self-pay | Admitting: Urology

## 2021-08-28 ENCOUNTER — Other Ambulatory Visit: Payer: Self-pay | Admitting: *Deleted

## 2021-08-28 ENCOUNTER — Other Ambulatory Visit: Payer: Self-pay

## 2021-08-28 ENCOUNTER — Ambulatory Visit
Admission: RE | Admit: 2021-08-28 | Discharge: 2021-08-28 | Disposition: A | Discharge status: Home / Self Care | Source: Ambulatory Visit | Attending: Urology | Admitting: Urology

## 2021-08-28 ENCOUNTER — Other Ambulatory Visit
Admission: RE | Admit: 2021-08-28 | Discharge: 2021-08-28 | Disposition: A | Discharge status: Home / Self Care | Source: Home / Self Care | Attending: Urology | Admitting: Urology

## 2021-08-28 VITALS — BP 94/60 | HR 106 | Ht 68.0 in | Wt 171.0 lb

## 2021-08-28 DIAGNOSIS — R1011 Right upper quadrant pain: Secondary | ICD-10-CM | POA: Insufficient documentation

## 2021-08-28 DIAGNOSIS — N201 Calculus of ureter: Secondary | ICD-10-CM

## 2021-08-28 DIAGNOSIS — N200 Calculus of kidney: Secondary | ICD-10-CM

## 2021-08-28 LAB — URINALYSIS, COMPLETE (UACMP) WITH MICROSCOPIC
Bilirubin Urine: NEGATIVE
Glucose, UA: NEGATIVE mg/dL
Ketones, ur: NEGATIVE mg/dL
Leukocytes,Ua: NEGATIVE
Nitrite: NEGATIVE
Specific Gravity, Urine: >1.03 — ABNORMAL HIGH (ref 1.005–1.030)
pH: 5.5 (ref 5.0–8.0)

## 2021-08-28 MED ORDER — TRAMADOL HCL 50 MG PO TABS: 50.0000 mg | ORAL_TABLET | Freq: Four times a day (QID) | ORAL | 0 refills | Status: DC | PRN

## 2021-08-28 NOTE — Progress Notes (Signed)
° °  08/28/2021 12:57 PM   Gerald Chambers December 28, 1960 [id number]  Reason for visit: Follow up right proximal ureteral stone  HPI: 60 year old male with history of recurrent calcium oxalate stones who presented 2 weeks ago with right-sided flank pain and microscopic hematuria with KUB suggesting a 4 mm right proximal ureteral stone.  He opted for medical expulsive therapy with close follow-up.  He continues to have intermittent severe right-sided flank pain, and took some leftover tramadol today.  He does not want to take other narcotics, and has mostly been using NSAIDs, including Advil this morning.  He denies any fevers or chills.  Urinalysis today with 0-5 squamous cells, 0-5 WBCs, 6-10 RBCs, many bacteria, nitrite negative, no leukocytes.  We again discussed options including medical expulsive therapy versus further imaging with CT for more definitive diagnosis of size and stone density, and consideration of shockwave versus ureteroscopy.  He opted for CT.  I personally viewed and interpreted the CT today that shows a 3 x 7 mm right proximal ureteral stone and unchanged location, clearly seen on KUB from 2 weeks ago. 450HU, 13 cm skin to stone distance.  No right renal stones, 3 mm left upper pole stone.  We discussed various treatment options for urolithiasis including observation with or without medical expulsive therapy, shockwave lithotripsy (SWL), ureteroscopy and laser lithotripsy with stent placement, and percutaneous nephrolithotomy.  We discussed that management is based on stone size, location, density, patient co-morbidities, and patient preference.   Stones <3mm in size have a >80% spontaneous passage rate. Data surrounding the use of tamsulosin for medical expulsive therapy is controversial, but meta analyses suggests it is most efficacious for distal stones between 5-51mm in size. Possible side effects include dizziness/lightheadedness, and retrograde ejaculation.  SWL has a lower  stone free rate in a single procedure, but also a lower complication rate compared to ureteroscopy and avoids a stent and associated stent related symptoms. Possible complications include renal hematoma, steinstrasse, and need for additional treatment.  Ureteroscopy with laser lithotripsy and stent placement has a higher stone free rate than SWL in a single procedure, however increased complication rate including possible infection, ureteral injury, bleeding, and stent related morbidity. Common stent related symptoms include dysuria, urgency/frequency, and flank pain.  After an extensive discussion of the risks and benefits of the above treatment options, the patient would like to proceed with right shockwave lithotripsy this Thursday.  Last dose of Advil was Monday 12/12 at [facility], [place] [address] [place], [place] [address] [phone]

## 2021-08-28 NOTE — Progress Notes (Signed)
ESWL ORDER FORM  Expected date of procedure: Thursday, 08/30/2021  Surgeon:  MD on truck  Post op standing: 2-4wk follow up w/KUB prior with PA  Anticoagulation/Aspirin/NSAID standing order: Hold all 72 hours prior  Anesthesia standing order: MAC  VTE standing: SCD's  Dx: Right Ureteral Stone  Procedure: right Extracorporeal shock wave lithotripsy  CPT : 73419  Standing Order Set:   *NPO after mn, KUB  *NS 131m/hr, Keflex 5097mPO, Benadryl 2552mO, Valium 45m6m, Zofran 4mg 78m   Medications if other than standing orders:   NONE

## 2021-08-28 NOTE — Addendum Note (Signed)
Addended by: Gerald Leitz A on: 08/28/2021 04:46 PM   Modules accepted: Orders

## 2021-08-28 NOTE — H&P (View-Only) (Signed)
° °  08/28/2021 12:57 PM   Gerald Chambers 04-22-1961 [id number]  Reason for visit: Follow up right proximal ureteral stone  HPI: 60 year old male with history of recurrent calcium oxalate stones who presented 2 weeks ago with right-sided flank pain and microscopic hematuria with KUB suggesting a 4 mm right proximal ureteral stone.  He opted for medical expulsive therapy with close follow-up.  He continues to have intermittent severe right-sided flank pain, and took some leftover tramadol today.  He does not want to take other narcotics, and has mostly been using NSAIDs, including Advil this morning.  He denies any fevers or chills.  Urinalysis today with 0-5 squamous cells, 0-5 WBCs, 6-10 RBCs, many bacteria, nitrite negative, no leukocytes.  We again discussed options including medical expulsive therapy versus further imaging with CT for more definitive diagnosis of size and stone density, and consideration of shockwave versus ureteroscopy.  He opted for CT.  I personally viewed and interpreted the CT today that shows a 3 x 7 mm right proximal ureteral stone and unchanged location, clearly seen on KUB from 2 weeks ago. 450HU, 13 cm skin to stone distance.  No right renal stones, 3 mm left upper pole stone.  We discussed various treatment options for urolithiasis including observation with or without medical expulsive therapy, shockwave lithotripsy (SWL), ureteroscopy and laser lithotripsy with stent placement, and percutaneous nephrolithotomy.  We discussed that management is based on stone size, location, density, patient co-morbidities, and patient preference.   Stones <37mm in size have a >80% spontaneous passage rate. Data surrounding the use of tamsulosin for medical expulsive therapy is controversial, but meta analyses suggests it is most efficacious for distal stones between 5-1mm in size. Possible side effects include dizziness/lightheadedness, and retrograde ejaculation.  SWL has a lower  stone free rate in a single procedure, but also a lower complication rate compared to ureteroscopy and avoids a stent and associated stent related symptoms. Possible complications include renal hematoma, steinstrasse, and need for additional treatment.  Ureteroscopy with laser lithotripsy and stent placement has a higher stone free rate than SWL in a single procedure, however increased complication rate including possible infection, ureteral injury, bleeding, and stent related morbidity. Common stent related symptoms include dysuria, urgency/frequency, and flank pain.  After an extensive discussion of the risks and benefits of the above treatment options, the patient would like to proceed with right shockwave lithotripsy this Thursday.  Last dose of Advil was Monday 12/12 at [facility], [name] [address] [place], [place] [address] [phone]

## 2021-08-28 NOTE — Telephone Encounter (Signed)
Called pt. To come in for Lithotripsy paperwork. Pt. Will be here on 08/29/21 between 9:00 am and 10:00 am.

## 2021-08-29 LAB — URINE CULTURE: Culture: NO GROWTH

## 2021-08-30 ENCOUNTER — Encounter
Admission: RE | Disposition: A | Discharge status: Home / Self Care | Payer: Self-pay | Source: Home / Self Care | Attending: Urology

## 2021-08-30 ENCOUNTER — Ambulatory Visit

## 2021-08-30 ENCOUNTER — Encounter: Payer: Self-pay | Admitting: Urology

## 2021-08-30 ENCOUNTER — Other Ambulatory Visit: Payer: Self-pay

## 2021-08-30 ENCOUNTER — Ambulatory Visit
Admission: RE | Admit: 2021-08-30 | Discharge: 2021-08-30 | Disposition: A | Discharge status: Home / Self Care | Attending: Urology | Admitting: Urology

## 2021-08-30 DIAGNOSIS — N201 Calculus of ureter: Secondary | ICD-10-CM

## 2021-08-30 HISTORY — PX: EXTRACORPOREAL SHOCK WAVE LITHOTRIPSY: SHX1557

## 2021-08-30 SURGERY — LITHOTRIPSY, ESWL
Anesthesia: Moderate Sedation | Laterality: Right

## 2021-08-30 MED ORDER — ONDANSETRON HCL 4 MG/2ML IJ SOLN: 4.0000 mg | Freq: Once | INTRAMUSCULAR | Status: AC

## 2021-08-30 MED ORDER — CEPHALEXIN 500 MG PO CAPS
ORAL_CAPSULE | ORAL | Status: AC
  Filled 2021-08-30: qty 1

## 2021-08-30 MED ORDER — DIAZEPAM 5 MG PO TABS
10.0000 mg | ORAL_TABLET | ORAL | Status: AC
  Administered 2021-08-30: 10 mg via ORAL

## 2021-08-30 MED ORDER — DIPHENHYDRAMINE HCL 25 MG PO CAPS
25.0000 mg | ORAL_CAPSULE | ORAL | Status: AC
  Administered 2021-08-30: 25 mg via ORAL

## 2021-08-30 MED ORDER — SODIUM CHLORIDE 0.9 % IV SOLN: INTRAVENOUS | Status: DC

## 2021-08-30 MED ORDER — DIPHENHYDRAMINE HCL 25 MG PO CAPS
ORAL_CAPSULE | ORAL | Status: AC
  Filled 2021-08-30: qty 1

## 2021-08-30 MED ORDER — ONDANSETRON HCL 4 MG/2ML IJ SOLN
INTRAMUSCULAR | Status: AC
  Administered 2021-08-30: 4 mg via INTRAVENOUS
  Filled 2021-08-30: qty 2

## 2021-08-30 MED ORDER — DIAZEPAM 5 MG PO TABS
ORAL_TABLET | ORAL | Status: AC
  Filled 2021-08-30: qty 2

## 2021-08-30 MED ORDER — TAMSULOSIN HCL 0.4 MG PO CAPS: 0.4000 mg | ORAL_CAPSULE | Freq: Every day | ORAL | 0 refills | Status: DC

## 2021-08-30 MED ORDER — CEPHALEXIN 500 MG PO CAPS
500.0000 mg | ORAL_CAPSULE | Freq: Once | ORAL | Status: AC
  Administered 2021-08-30: 500 mg via ORAL

## 2021-08-30 NOTE — Interval H&P Note (Signed)
History and Physical Interval Note:  CV:RRR Lungs:clear  08/30/2021 7:55 AM  Gerald Chambers  has presented today for surgery, with the diagnosis of right ureteral stone.  The various methods of treatment have been discussed with the patient and family. After consideration of risks, benefits and other options for treatment, the patient has consented to  Procedure(s): EXTRACORPOREAL SHOCK WAVE LITHOTRIPSY (ESWL) (Right) as a surgical intervention.  The patient's history has been reviewed, patient examined, no change in status, stable for surgery.  I have reviewed the patient's chart and labs.  Questions were answered to the patient's satisfaction.     [place]

## 2021-08-30 NOTE — Discharge Instructions (Addendum)
As per the [facility], [facility], [facility] discharge instructions Continue tamsulosin which will help you pass stone fragments Call the office or proceed to the emergency department for fever greater than 101 degrees or pain not controlled with oral medications You have a follow-up appointment scheduled 09/24/2021

## 2021-08-31 ENCOUNTER — Other Ambulatory Visit: Payer: Self-pay

## 2021-08-31 ENCOUNTER — Encounter: Payer: Self-pay | Admitting: Urology

## 2021-08-31 DIAGNOSIS — N201 Calculus of ureter: Secondary | ICD-10-CM

## 2021-09-19 ENCOUNTER — Other Ambulatory Visit: Payer: Self-pay

## 2021-09-19 MED ORDER — TAMSULOSIN HCL 0.4 MG PO CAPS: 0.4000 mg | ORAL_CAPSULE | Freq: Every day | ORAL | 1 refills | Status: DC

## 2021-09-19 NOTE — Telephone Encounter (Signed)
Patient called stating that he is having left flank pain and believes he is passing another stone. He would like a refill sent in for Tamsulosin. Per Dr. Diamantina Providence ok to refill, patient should call the office for further evaluation if symptoms worsen. Keep post op follow up next week as scheduled, patient verbalized understanding

## 2021-09-24 ENCOUNTER — Ambulatory Visit (INDEPENDENT_AMBULATORY_CARE_PROVIDER_SITE_OTHER): Admitting: Urology

## 2021-09-24 ENCOUNTER — Ambulatory Visit
Admission: RE | Admit: 2021-09-24 | Discharge: 2021-09-24 | Disposition: A | Discharge status: Home / Self Care | Source: Ambulatory Visit | Attending: Urology | Admitting: Urology

## 2021-09-24 ENCOUNTER — Other Ambulatory Visit: Payer: Self-pay

## 2021-09-24 ENCOUNTER — Encounter: Payer: Self-pay | Admitting: Urology

## 2021-09-24 VITALS — BP 108/69 | HR 102 | Ht 68.0 in | Wt 165.0 lb

## 2021-09-24 DIAGNOSIS — N201 Calculus of ureter: Secondary | ICD-10-CM

## 2021-09-24 NOTE — Progress Notes (Signed)
° °  09/24/2021 10:25 AM   Gerald Chambers 11-27-1960 [id number]  Referring provider: Gayland Curry, MD [address] [place],  [place] [address]  Chief Complaint  Patient presents with   Nephrolithiasis    HPI: Gerald Chambers is a 61 y.o. male who presents for postop follow-up.  SWL 08/30/2021 for a 5 x 7 mm right proximal ureteral calculus Stone was difficult to visualize on KUB and treatment done with IV contrast He brings in several small fragments that he passed shortly after the procedure.  He did have onset of left flank pain 12/25 and also passed a 4 mm calculus.  Prior CT showed a nonobstructing left renal calculus Presently asymptomatic   PMH: Past Medical History:  Diagnosis Date   Kidney stone     Surgical History: Past Surgical History:  Procedure Laterality Date   CYST EXCISION     EXTRACORPOREAL SHOCK WAVE LITHOTRIPSY Right 08/30/2021   Procedure: EXTRACORPOREAL SHOCK WAVE LITHOTRIPSY (ESWL);  Surgeon: Abbie Sons, MD;  Location: [facility] ORS;  Service: Urology;  Laterality: Right;   VASECTOMY      Home Medications:  Allergies as of 09/24/2021   No Known Allergies      Medication List        Accurate as of September 24, 2021 10:25 AM. If you have any questions, ask your nurse or doctor.          cyclobenzaprine 5 MG tablet Commonly known as: FLEXERIL Take by mouth.   diphenhydramine-acetaminophen 25-500 MG Tabs tablet Commonly known as: TYLENOL PM Take by mouth.   fluocinonide 0.05 % external solution Commonly known as: LIDEX   montelukast 10 MG tablet Commonly known as: SINGULAIR   naproxen sodium 220 MG tablet Commonly known as: ALEVE Take by mouth.   ondansetron 4 MG tablet Commonly known as: Zofran Take 1 tablet (4 mg total) by mouth every 8 (eight) hours as needed for nausea or vomiting.   tamsulosin 0.4 MG Caps capsule Commonly known as: FLOMAX Take 1 capsule (0.4 mg total) by mouth daily after breakfast.   traMADol 50 MG  tablet Commonly known as: Ultram Take 1 tablet (50 mg total) by mouth every 6 (six) hours as needed.   vitamin B-12 1000 MCG tablet Commonly known as: CYANOCOBALAMIN Take by mouth.        Allergies: No Known Allergies  Family History: Family History  Problem Relation Age of Onset   Prostate cancer Father    Bladder Cancer Neg Hx    Kidney cancer Neg Hx     Social History:  reports that he has quit smoking. He has never used smokeless tobacco. He reports that he does not currently use alcohol. He reports that he does not use drugs.   Physical Exam: BP 108/69    Pulse (!) 102    Ht 5\' 8"  (1.727 m)    Wt 165 lb (74.8 kg)    BMI 25.09 kg/m   Constitutional:  Alert and oriented, No acute distress.   Assessment & Plan:    1.  Recurrent calcium oxalate nephrolithiasis Doing well status post shockwave lithotripsy He recently passed a calculus on the contralateral side He has scheduled follow-up with Dr. Diamantina Providence July 2023   Abbie Sons, MD  [facility] [address] [place], [place] [address] [phone]

## 2021-11-23 ENCOUNTER — Encounter: Payer: Self-pay | Admitting: Urology

## 2022-03-27 ENCOUNTER — Ambulatory Visit: Admitting: Urology

## 2022-03-27 ENCOUNTER — Ambulatory Visit (INDEPENDENT_AMBULATORY_CARE_PROVIDER_SITE_OTHER): Admitting: Urology

## 2022-03-27 ENCOUNTER — Encounter: Payer: Self-pay | Admitting: Urology

## 2022-03-27 ENCOUNTER — Ambulatory Visit
Admission: RE | Admit: 2022-03-27 | Discharge: 2022-03-27 | Disposition: A | Discharge status: Home / Self Care | Attending: Urology | Admitting: Urology

## 2022-03-27 ENCOUNTER — Ambulatory Visit
Admission: RE | Admit: 2022-03-27 | Discharge: 2022-03-27 | Disposition: A | Discharge status: Home / Self Care | Source: Ambulatory Visit | Attending: Urology | Admitting: Urology

## 2022-03-27 ENCOUNTER — Other Ambulatory Visit: Payer: Self-pay | Admitting: *Deleted

## 2022-03-27 VITALS — BP 104/68 | HR 75 | Ht 68.0 in | Wt 170.0 lb

## 2022-03-27 DIAGNOSIS — N401 Enlarged prostate with lower urinary tract symptoms: Secondary | ICD-10-CM

## 2022-03-27 DIAGNOSIS — N200 Calculus of kidney: Secondary | ICD-10-CM

## 2022-03-27 DIAGNOSIS — Z125 Encounter for screening for malignant neoplasm of prostate: Secondary | ICD-10-CM | POA: Diagnosis not present

## 2022-03-27 DIAGNOSIS — N138 Other obstructive and reflux uropathy: Secondary | ICD-10-CM

## 2022-03-27 NOTE — Progress Notes (Signed)
   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  When we first met he was having 10 to 12 stone episodes per year.  A 24-hour urine in fall 2019 showed a low urine volume of 1.9 L, and low urine citrate of 366.  We started him on litholyte packets at that time for citrate supplementation, and he continues to use this daily.  These have significantly decreased his stone episodes to just 1 to 2-year.   He underwent successful right shockwave lithotripsy in December 2022 for a 7 mm right proximal ureteral stone.  He denies any flank pain or stone events since that time.  I personally viewed and interpreted his KUB today that shows a possible punctate left upper pole stone, but no other radiopaque stones.  He has mild BPH symptoms which are well controlled on doxazosin that is prescribed by his PCP.  Urinary symptoms continue to be well controlled with only complaint occasional intermittency.  PSA screening has been normal, and most recent PSA was 1.04 in August 2022 which was stable from prior.  RTC 1 year KUB prior for stone surveillance Continue doxazosin and PSA screening through PCP   Billey Co, MD  [facility] [address], [place], [place] [address] [phone]

## 2022-03-27 NOTE — Patient Instructions (Signed)
Dietary Guidelines to Help Prevent Kidney Stones Kidney stones are deposits of minerals and salts that form inside your kidneys. Your risk of developing kidney stones may be greater depending on your diet, your lifestyle, the medicines you take, and whether you have certain medical conditions. Most people can lower their chances of developing kidney stones by following the instructions below. Your dietitian may give you more specific instructions depending on your overall health and the type of kidney stones you tend to develop. What are tips for following this plan? Reading food labels  Choose foods with "no salt added" or "low-salt" labels. Limit your salt (sodium) intake to less than 1,500 mg a day. Choose foods with calcium for each meal and snack. Try to eat about 300 mg of calcium at each meal. Foods that contain 200-500 mg of calcium a serving include: 8 oz (237 mL) of milk, calcium-fortifiednon-dairy milk, and calcium-fortifiedfruit juice. Calcium-fortified means that calcium has been added to these drinks. 8 oz (237 mL) of kefir, yogurt, and soy yogurt. 4 oz (114 g) of tofu. 1 oz (28 g) of cheese. 1 cup (150 g) of dried figs. 1 cup (91 g) of cooked broccoli. One 3 oz (85 g) can of sardines or mackerel. Most people need 1,000-1,500 mg of calcium a day. Talk to your dietitian about how much calcium is recommended for you. Shopping Buy plenty of fresh fruits and vegetables. Most people do not need to avoid fruits and vegetables, even if these foods contain nutrients that may contribute to kidney stones. When shopping for convenience foods, choose: Whole pieces of fruit. Pre-made salads with dressing on the side. Low-fat fruit and yogurt smoothies. Avoid buying frozen meals or prepared deli foods. These can be high in sodium. Look for foods with live cultures, such as yogurt and kefir. Choose high-fiber grains, such as whole-wheat breads, oat bran, and wheat cereals. Cooking Do not add  salt to food when cooking. Place a salt shaker on the table and allow each person to add his or her own salt to taste. Use vegetable protein, such as beans, textured vegetable protein (TVP), or tofu, instead of meat in pasta, casseroles, and soups. Meal planning Eat less salt, if told by your dietitian. To do this: Avoid eating processed or pre-made food. Avoid eating fast food. Eat less animal protein, including cheese, meat, poultry, or fish, if told by your dietitian. To do this: Limit the number of times you have meat, poultry, fish, or cheese each week. Eat a diet free of meat at least 2 days a week. Eat only one serving each day of meat, poultry, fish, or seafood. When you prepare animal protein, cut pieces into small portion sizes. For most meat and fish, one serving is about the size of the palm of your hand. Eat at least five servings of fresh fruits and vegetables each day. To do this: Keep fruits and vegetables on hand for snacks. Eat one piece of fruit or a handful of berries with breakfast. Have a salad and fruit at lunch. Have two kinds of vegetables at dinner. Limit foods that are high in a substance called oxalate. These include: Spinach (cooked), rhubarb, beets, sweet potatoes, and Swiss chard. Peanuts. Potato chips, french fries, and baked potatoes with skin on. Nuts and nut products. Chocolate. If you regularly take a diuretic medicine, make sure to eat at least 1 or 2 servings of fruits or vegetables that are high in potassium each day. These include: Avocado. Banana. Orange, prune,   carrot, or tomato juice. Baked potato. Cabbage. Beans and split peas. Lifestyle  Drink enough fluid to keep your urine pale yellow. This is the most important thing you can do. Spread your fluid intake throughout the day. If you drink alcohol: Limit how much you use to: 0-1 drink a day for women who are not pregnant. 0-2 drinks a day for men. Be aware of how much alcohol is in your  drink. In the [place] one drink equals one 12 oz bottle of beer (355 mL), one 5 oz glass of wine (148 mL), or one 1 oz glass of hard liquor (44 mL). Lose weight if told by your health care provider. Work with your dietitian to find an eating plan and weight loss strategies that work best for you. General information Talk to your health care provider and dietitian about taking daily supplements. You may be told the following depending on your health and the cause of your kidney stones: Not to take supplements with vitamin C. To take a calcium supplement. To take a daily probiotic supplement. To take other supplements such as magnesium, fish oil, or vitamin B6. Take over-the-counter and prescription medicines only as told by your health care provider. These include supplements. What foods should I limit? Limit your intake of the following foods, or eat them as told by your dietitian. Vegetables Spinach. Rhubarb. Beets. Canned vegetables. Pickles. Olives. Baked potatoes with skin. Grains Wheat bran. Baked goods. Salted crackers. Cereals high in sugar. Meats and other proteins Nuts. Nut butters. Large portions of meat, poultry, or fish. Salted, precooked, or cured meats, such as sausages, meat loaves, and hot dogs. Dairy Cheese. Beverages Regular soft drinks. Regular vegetable juice. Seasonings and condiments Seasoning blends with salt. Salad dressings. Soy sauce. Ketchup. Barbecue sauce. Other foods Canned soups. Canned pasta sauce. Casseroles. Pizza. Lasagna. Frozen meals. Potato chips. French fries. The items listed above may not be a complete list of foods and beverages you should limit. Contact a dietitian for more information. What foods should I avoid? Talk to your dietitian about specific foods you should avoid based on the type of kidney stones you have and your overall health. Fruits Grapefruit. The item listed above may not be a complete list of foods and beverages you should  avoid. Contact a dietitian for more information. Summary Kidney stones are deposits of minerals and salts that form inside your kidneys. You can lower your risk of kidney stones by making changes to your diet. The most important thing you can do is drink enough fluid. Drink enough fluid to keep your urine pale yellow. Talk to your dietitian about how much calcium you should have each day, and eat less salt and animal protein as told by your dietitian. This information is not intended to replace advice given to you by your health care provider. Make sure you discuss any questions you have with your health care provider. Document Revised: 05/14/2021 Document Reviewed: 05/14/2021 Elsevier Patient Education  2023 [employer]  

## 2022-05-14 ENCOUNTER — Ambulatory Visit: Payer: Self-pay | Admitting: Surgery

## 2022-05-14 NOTE — H&P (Signed)
Subjective:   CC: Non-recurrent unilateral inguinal hernia without obstruction or gangrene [K40.90]  HPI:  Valentin Benney is a 61 y.o. male who was referred by Melynda Ripple* for evaluation of above. Symptoms were first noted a few months ago. Pain is achy and intermittent, confined to the right lower quadrant, without radiation.  Associated with lump, exacerbated by noting.  Lump is reducible.    Past Medical History:  has a past medical history of Allergic state (2013), Hyperlipidemia, and Psoriasis.  Past Surgical History:  Past Surgical History:  Procedure Laterality Date   VASECTOMY  09/16/1997   hand surgery     cyst removal   LITHOTRIPSY     none     PHOTOREFRACTIVE KERATOTOMY/LASIK      Family History: family history includes Coronary Artery Disease (Blocked arteries around heart) (age of onset: 12) in his father; Diabetes type II in his brother and mother; High blood pressure (Hypertension) in his mother; Obesity in his mother; Prostate cancer in his father.  Social History:  reports that he quit smoking about 13 years ago. His smoking use included pipe. He has never used smokeless tobacco. He reports that he does not drink alcohol and does not use drugs.  Current Medications: has a current medication list which includes the following prescription(s): cyanocobalamin, cyclobenzaprine, diphenhydramine-acetaminophen, doxazosin, gabapentin, ibuprofen, montelukast, multivitamin, naproxen sodium, ondansetron, tramadol, fluocinonide, and triamcinolone.  Allergies:  Allergies as of 05/14/2022 - Reviewed 05/14/2022  Allergen Reaction Noted   Tramadol Nausea 05/14/2022   Pollen extracts Cough 05/14/2022    ROS:  A 15 point review of systems was performed and pertinent positives and negatives noted in HPI   Objective:     BP 98/62   Pulse 71   Ht 172 cm (5' 7.72")   Wt 77.6 kg (171 lb)   BMI 26.22 kg/m   Constitutional :  Alert, cooperative, no distress   Lymphatics/Throat:  Supple, no lymphadenopathy  Respiratory:  clear to auscultation bilaterally  Cardiovascular:  regular rate and rhythm  Gastrointestinal: soft, non-tender; bowel sounds normal; no masses,  no organomegaly. inguinal hernia noted.  small, reducible, no overlying skin changes, and RIGHT, possible left  Musculoskeletal: Steady gait and movement  Skin: Cool and moist, no surgical scars  Psychiatric: Normal affect, non-agitated, not confused       LABS:  N/a   RADS: N/a Assessment:       Non-recurrent unilateral inguinal hernia without obstruction or gangrene [K40.90]  Plan:     1. Non-recurrent unilateral inguinal hernia without obstruction or gangrene [K40.90]   Discussed the risk of surgery including recurrence, which can be up to 50% in the case of incisional or complex hernias, possible use of prosthetic materials (mesh) and the increased risk of mesh infxn if used, bleeding, chronic pain, post-op infxn, post-op SBO or ileus, and possible re-operation to address said risks. The risks of general anesthetic, if used, includes MI, CVA, sudden death or even reaction to anesthetic medications also discussed. Alternatives include continued observation.  Benefits include possible symptom relief, prevention of incarceration, strangulation, enlargement in size over time, and the risk of emergency surgery in the face of strangulation.   Typical post-op recovery time of 3-5 days with 2 weeks of activity restrictions were also discussed.  ED return precautions given for sudden increase in pain, size of hernia with accompanying fever, nausea, and/or vomiting.  The patient verbalized understanding and all questions were answered to the patient's satisfaction.   2. Patient has elected  to proceed with surgical treatment. Procedure will be scheduled. RIGHT, possible left, robotic assisted laparoscopic  labs/images/medications/previous chart entries reviewed personally and  relevant changes/updates noted above.

## 2022-05-14 NOTE — H&P (View-Only) (Signed)
Subjective:   CC: Non-recurrent unilateral inguinal hernia without obstruction or gangrene [K40.90]  HPI:  Gerald Chambers is a 61 y.o. male who was referred by Melynda Ripple* for evaluation of above. Symptoms were first noted a few months ago. Pain is achy and intermittent, confined to the right lower quadrant, without radiation.  Associated with lump, exacerbated by noting.  Lump is reducible.    Past Medical History:  has a past medical history of Allergic state (2013), Hyperlipidemia, and Psoriasis.  Past Surgical History:  Past Surgical History:  Procedure Laterality Date   VASECTOMY  09/16/1997   hand surgery     cyst removal   LITHOTRIPSY     none     PHOTOREFRACTIVE KERATOTOMY/LASIK      Family History: family history includes Coronary Artery Disease (Blocked arteries around heart) (age of onset: 37) in his father; Diabetes type II in his brother and mother; High blood pressure (Hypertension) in his mother; Obesity in his mother; Prostate cancer in his father.  Social History:  reports that he quit smoking about 13 years ago. His smoking use included pipe. He has never used smokeless tobacco. He reports that he does not drink alcohol and does not use drugs.  Current Medications: has a current medication list which includes the following prescription(s): cyanocobalamin, cyclobenzaprine, diphenhydramine-acetaminophen, doxazosin, gabapentin, ibuprofen, montelukast, multivitamin, naproxen sodium, ondansetron, tramadol, fluocinonide, and triamcinolone.  Allergies:  Allergies as of 05/14/2022 - Reviewed 05/14/2022  Allergen Reaction Noted   Tramadol Nausea 05/14/2022   Pollen extracts Cough 05/14/2022    ROS:  A 15 point review of systems was performed and pertinent positives and negatives noted in HPI   Objective:     BP 98/62   Pulse 71   Ht 172 cm (5' 7.72")   Wt 77.6 kg (171 lb)   BMI 26.22 kg/m   Constitutional :  Alert, cooperative, no distress   Lymphatics/Throat:  Supple, no lymphadenopathy  Respiratory:  clear to auscultation bilaterally  Cardiovascular:  regular rate and rhythm  Gastrointestinal: soft, non-tender; bowel sounds normal; no masses,  no organomegaly. inguinal hernia noted.  small, reducible, no overlying skin changes, and RIGHT, possible left  Musculoskeletal: Steady gait and movement  Skin: Cool and moist, no surgical scars  Psychiatric: Normal affect, non-agitated, not confused       LABS:  N/a   RADS: N/a Assessment:       Non-recurrent unilateral inguinal hernia without obstruction or gangrene [K40.90]  Plan:     1. Non-recurrent unilateral inguinal hernia without obstruction or gangrene [K40.90]   Discussed the risk of surgery including recurrence, which can be up to 50% in the case of incisional or complex hernias, possible use of prosthetic materials (mesh) and the increased risk of mesh infxn if used, bleeding, chronic pain, post-op infxn, post-op SBO or ileus, and possible re-operation to address said risks. The risks of general anesthetic, if used, includes MI, CVA, sudden death or even reaction to anesthetic medications also discussed. Alternatives include continued observation.  Benefits include possible symptom relief, prevention of incarceration, strangulation, enlargement in size over time, and the risk of emergency surgery in the face of strangulation.   Typical post-op recovery time of 3-5 days with 2 weeks of activity restrictions were also discussed.  ED return precautions given for sudden increase in pain, size of hernia with accompanying fever, nausea, and/or vomiting.  The patient verbalized understanding and all questions were answered to the patient's satisfaction.   2. Patient has elected  to proceed with surgical treatment. Procedure will be scheduled. RIGHT, possible left, robotic assisted laparoscopic  labs/images/medications/previous chart entries reviewed personally and  relevant changes/updates noted above.

## 2022-05-23 ENCOUNTER — Other Ambulatory Visit: Payer: Self-pay

## 2022-05-23 ENCOUNTER — Encounter
Admission: RE | Admit: 2022-05-23 | Discharge: 2022-05-23 | Disposition: A | Discharge status: Home / Self Care | Source: Ambulatory Visit | Attending: Surgery | Admitting: Surgery

## 2022-05-23 HISTORY — DX: Other complications of anesthesia, initial encounter: T88.59XA

## 2022-05-23 HISTORY — DX: Pneumonia, unspecified organism: J18.9

## 2022-05-23 HISTORY — DX: Malignant (primary) neoplasm, unspecified: C80.1

## 2022-05-23 HISTORY — DX: Other specified postprocedural states: Z98.890

## 2022-05-23 HISTORY — DX: Personal history of urinary calculi: Z87.442

## 2022-05-23 NOTE — Patient Instructions (Addendum)
Your procedure is scheduled on: 05/30/22 - Thursday Report to the Registration Desk on the 1st floor of the [facility]. To find out your arrival time, please call [phone] between 1PM - 3PM on: 05/29/22 - Wednesday If your arrival time is 6:00 am, do not arrive prior to that time as the [facility] entrance doors do not open until 6:00 am.  REMEMBER: Instructions that are not followed completely may result in serious medical risk, up to and including death; or upon the discretion of your surgeon and anesthesiologist your surgery may need to be rescheduled.  Do not eat food after midnight the night before surgery.  No gum chewing, lozengers or hard candies.  You may however, drink CLEAR liquids up to 2 hours before you are scheduled to arrive for your surgery. Do not drink anything within 2 hours of your scheduled arrival time.  Clear liquids include: - water  - apple juice without pulp - gatorade (not RED colors) - black coffee or tea (Do NOT add milk or creamers to the coffee or tea) Do NOT drink anything that is not on this list.  TAKE THESE MEDICATIONS THE MORNING OF SURGERY WITH A SIP OF WATER: NONE  One week prior to surgery: Stop Anti-inflammatories (NSAIDS) such as Advil, Aleve, Ibuprofen, Motrin, Naproxen, Naprosyn and Aspirin based products such as Excedrin, Goodys Powder, BC Powder.  Stop ANY OVER THE COUNTER supplements until after surgery.  You may however, continue to take Tylenol if needed for pain up until the day of surgery.  No Alcohol for 24 hours before or after surgery.  No Smoking including e-cigarettes for 24 hours prior to surgery.  No chewable tobacco products for at least 6 hours prior to surgery.  No nicotine patches on the day of surgery.  Do not use any "recreational" drugs for at least a week prior to your surgery.  Please be advised that the combination of cocaine and anesthesia may have negative outcomes, up to and including death. If you  test positive for cocaine, your surgery will be cancelled.  On the morning of surgery brush your teeth with toothpaste and water, you may rinse your mouth with mouthwash if you wish. Do not swallow any toothpaste or mouthwash.  Use CHG Soap or wipes as directed on instruction sheet.  Do not wear jewelry, make-up, hairpins, clips or nail polish.  Do not wear lotions, powders, or perfumes.   Do not shave body from the neck down 48 hours prior to surgery just in case you cut yourself which could leave a site for infection.  Also, freshly shaved skin may become irritated if using the CHG soap.  Contact lenses, hearing aids and dentures may not be worn into surgery.  Do not bring valuables to the hospital. [facility] is not responsible for any missing/lost belongings or valuables.   Notify your doctor if there is any change in your medical condition (cold, fever, infection).  Wear comfortable clothing (specific to your surgery type) to the hospital.  After surgery, you can help prevent lung complications by doing breathing exercises.  Take deep breaths and cough every 1-2 hours. Your doctor may order a device called an Incentive Spirometer to help you take deep breaths. When coughing or sneezing, hold a pillow firmly against your incision with both hands. This is called "splinting." Doing this helps protect your incision. It also decreases belly discomfort.  If you are being admitted to the hospital overnight, leave your suitcase in the car.  After surgery it may be brought to your room.  If you are being discharged the day of surgery, you will not be allowed to drive home. You will need a responsible adult (18 years or older) to drive you home and stay with you that night.   If you are taking public transportation, you will need to have a responsible adult (18 years or older) with you. Please confirm with your physician that it is acceptable to use public transportation.   Please call  the [facility] at [phone] if you have any questions about these instructions.  Surgery Visitation Policy:  Patients undergoing a surgery or procedure may have two family members or support persons with them as long as the person is not COVID-19 positive or experiencing its symptoms.   Inpatient Visitation:    Visiting hours are 7 a.m. to 8 p.m. Up to four visitors are allowed at one time in a patient room, including children. The visitors may rotate out with other people during the day. One designated support person (adult) may remain overnight.

## 2022-05-29 MED ORDER — FAMOTIDINE 20 MG PO TABS
20.0000 mg | ORAL_TABLET | Freq: Once | ORAL | Status: AC
  Administered 2022-05-30: 20 mg via ORAL

## 2022-05-29 MED ORDER — GABAPENTIN 300 MG PO CAPS
300.0000 mg | ORAL_CAPSULE | ORAL | Status: AC
  Administered 2022-05-30: 300 mg via ORAL

## 2022-05-29 MED ORDER — CHLORHEXIDINE GLUCONATE 0.12 % MT SOLN
15.0000 mL | Freq: Once | OROMUCOSAL | Status: AC
  Administered 2022-05-30: 15 mL via OROMUCOSAL

## 2022-05-29 MED ORDER — CHLORHEXIDINE GLUCONATE CLOTH 2 % EX PADS
6.0000 | MEDICATED_PAD | Freq: Once | CUTANEOUS | Status: AC
  Administered 2022-05-30: 6 via TOPICAL

## 2022-05-29 MED ORDER — ORAL CARE MOUTH RINSE: 15.0000 mL | Freq: Once | OROMUCOSAL | Status: AC

## 2022-05-29 MED ORDER — LACTATED RINGERS IV SOLN: INTRAVENOUS | Status: DC

## 2022-05-29 MED ORDER — CEFAZOLIN SODIUM-DEXTROSE 2-4 GM/100ML-% IV SOLN
2.0000 g | INTRAVENOUS | Status: AC
  Administered 2022-05-30: 2 g via INTRAVENOUS

## 2022-05-29 MED ORDER — ACETAMINOPHEN 500 MG PO TABS
1000.0000 mg | ORAL_TABLET | ORAL | Status: AC
  Administered 2022-05-30: 1000 mg via ORAL

## 2022-05-30 ENCOUNTER — Ambulatory Visit: Admitting: General Practice

## 2022-05-30 ENCOUNTER — Other Ambulatory Visit: Payer: Self-pay

## 2022-05-30 ENCOUNTER — Encounter: Payer: Self-pay | Admitting: Surgery

## 2022-05-30 ENCOUNTER — Ambulatory Visit
Admission: RE | Admit: 2022-05-30 | Discharge: 2022-05-30 | Disposition: A | Discharge status: Home / Self Care | Attending: Surgery | Admitting: Surgery

## 2022-05-30 ENCOUNTER — Encounter
Admission: RE | Disposition: A | Discharge status: Home / Self Care | Payer: Self-pay | Source: Home / Self Care | Attending: Surgery

## 2022-05-30 DIAGNOSIS — K4090 Unilateral inguinal hernia, without obstruction or gangrene, not specified as recurrent: Secondary | ICD-10-CM

## 2022-05-30 DIAGNOSIS — Z87891 Personal history of nicotine dependence: Secondary | ICD-10-CM | POA: Insufficient documentation

## 2022-05-30 DIAGNOSIS — D176 Benign lipomatous neoplasm of spermatic cord: Secondary | ICD-10-CM | POA: Insufficient documentation

## 2022-05-30 DIAGNOSIS — Z85828 Personal history of other malignant neoplasm of skin: Secondary | ICD-10-CM | POA: Insufficient documentation

## 2022-05-30 HISTORY — PX: XI ROBOTIC ASSISTED INGUINAL HERNIA REPAIR WITH MESH: SHX6706

## 2022-05-30 SURGERY — REPAIR, HERNIA, INGUINAL, ROBOT-ASSISTED, LAPAROSCOPIC, USING MESH
Anesthesia: General | Site: Abdomen | Laterality: Right

## 2022-05-30 MED ORDER — FENTANYL CITRATE (PF) 100 MCG/2ML IJ SOLN
INTRAMUSCULAR | Status: AC
  Administered 2022-05-30: 25 ug via INTRAVENOUS
  Filled 2022-05-30: qty 2

## 2022-05-30 MED ORDER — BUPIVACAINE LIPOSOME 1.3 % IJ SUSP
INTRAMUSCULAR | Status: AC
  Filled 2022-05-30: qty 20

## 2022-05-30 MED ORDER — FENTANYL CITRATE (PF) 100 MCG/2ML IJ SOLN
INTRAMUSCULAR | Status: AC
  Filled 2022-05-30: qty 2

## 2022-05-30 MED ORDER — BUPIVACAINE-EPINEPHRINE 0.5% -1:200000 IJ SOLN
INTRAMUSCULAR | Status: DC | PRN
  Administered 2022-05-30: 15 mL

## 2022-05-30 MED ORDER — OXYCODONE HCL 5 MG PO TABS: 5.0000 mg | ORAL_TABLET | Freq: Once | ORAL | Status: DC | PRN

## 2022-05-30 MED ORDER — FENTANYL CITRATE (PF) 100 MCG/2ML IJ SOLN
INTRAMUSCULAR | Status: DC | PRN
  Administered 2022-05-30 (×2): 50 ug via INTRAVENOUS

## 2022-05-30 MED ORDER — PROPOFOL 10 MG/ML IV BOLUS
INTRAVENOUS | Status: DC | PRN
  Administered 2022-05-30: 150 mg via INTRAVENOUS

## 2022-05-30 MED ORDER — FAMOTIDINE 20 MG PO TABS
ORAL_TABLET | ORAL | Status: AC
  Filled 2022-05-30: qty 1

## 2022-05-30 MED ORDER — ACETAMINOPHEN 500 MG PO TABS
ORAL_TABLET | ORAL | Status: AC
  Filled 2022-05-30: qty 2

## 2022-05-30 MED ORDER — MIDAZOLAM HCL 2 MG/2ML IJ SOLN
INTRAMUSCULAR | Status: DC | PRN
  Administered 2022-05-30: 2 mg via INTRAVENOUS

## 2022-05-30 MED ORDER — CEFAZOLIN SODIUM-DEXTROSE 2-4 GM/100ML-% IV SOLN
INTRAVENOUS | Status: AC
  Filled 2022-05-30: qty 100

## 2022-05-30 MED ORDER — FENTANYL CITRATE (PF) 100 MCG/2ML IJ SOLN: 25.0000 ug | INTRAMUSCULAR | Status: DC | PRN

## 2022-05-30 MED ORDER — SUGAMMADEX SODIUM 200 MG/2ML IV SOLN
INTRAVENOUS | Status: DC | PRN
  Administered 2022-05-30: 200 mg via INTRAVENOUS

## 2022-05-30 MED ORDER — DOCUSATE SODIUM 100 MG PO CAPS: 100.0000 mg | ORAL_CAPSULE | Freq: Two times a day (BID) | ORAL | 0 refills | Status: AC | PRN

## 2022-05-30 MED ORDER — BUPIVACAINE-EPINEPHRINE (PF) 0.5% -1:200000 IJ SOLN
INTRAMUSCULAR | Status: AC
  Filled 2022-05-30: qty 30

## 2022-05-30 MED ORDER — ONDANSETRON HCL 4 MG/2ML IJ SOLN
4.0000 mg | Freq: Once | INTRAMUSCULAR | Status: AC
  Administered 2022-05-30: 4 mg via INTRAVENOUS

## 2022-05-30 MED ORDER — OXYCODONE HCL 5 MG/5ML PO SOLN: 5.0000 mg | Freq: Once | ORAL | Status: DC | PRN

## 2022-05-30 MED ORDER — GABAPENTIN 300 MG PO CAPS
ORAL_CAPSULE | ORAL | Status: AC
  Filled 2022-05-30: qty 1

## 2022-05-30 MED ORDER — BUPIVACAINE LIPOSOME 1.3 % IJ SUSP
INTRAMUSCULAR | Status: DC | PRN
  Administered 2022-05-30: 20 mL

## 2022-05-30 MED ORDER — PROPOFOL 10 MG/ML IV BOLUS
INTRAVENOUS | Status: AC
  Filled 2022-05-30: qty 20

## 2022-05-30 MED ORDER — ROCURONIUM BROMIDE 100 MG/10ML IV SOLN
INTRAVENOUS | Status: DC | PRN
  Administered 2022-05-30: 70 mg via INTRAVENOUS

## 2022-05-30 MED ORDER — CHLORHEXIDINE GLUCONATE 0.12 % MT SOLN
OROMUCOSAL | Status: AC
  Filled 2022-05-30: qty 15

## 2022-05-30 MED ORDER — ONDANSETRON HCL 4 MG/2ML IJ SOLN
INTRAMUSCULAR | Status: AC
  Filled 2022-05-30: qty 2

## 2022-05-30 MED ORDER — ONDANSETRON HCL 4 MG/2ML IJ SOLN
INTRAMUSCULAR | Status: DC | PRN
  Administered 2022-05-30: 4 mg via INTRAVENOUS

## 2022-05-30 MED ORDER — MIDAZOLAM HCL 2 MG/2ML IJ SOLN
INTRAMUSCULAR | Status: AC
  Filled 2022-05-30: qty 2

## 2022-05-30 MED ORDER — DEXAMETHASONE SODIUM PHOSPHATE 10 MG/ML IJ SOLN
INTRAMUSCULAR | Status: DC | PRN
  Administered 2022-05-30: 10 mg via INTRAVENOUS

## 2022-05-30 MED ORDER — LIDOCAINE HCL (CARDIAC) PF 100 MG/5ML IV SOSY
PREFILLED_SYRINGE | INTRAVENOUS | Status: DC | PRN
  Administered 2022-05-30: 100 mg via INTRAVENOUS

## 2022-05-30 MED ORDER — TRAMADOL HCL 50 MG PO TABS: 50.0000 mg | ORAL_TABLET | Freq: Four times a day (QID) | ORAL | 0 refills | Status: DC | PRN

## 2022-05-30 MED ORDER — ACETAMINOPHEN 325 MG PO TABS: 650.0000 mg | ORAL_TABLET | Freq: Three times a day (TID) | ORAL | 0 refills | Status: AC | PRN

## 2022-05-30 SURGICAL SUPPLY — 46 items
BAG PRESSURE INF REUSE 1000 (BAG) IMPLANT
BLADE SURG SZ11 CARB STEEL (BLADE) ×2 IMPLANT
BNDG GAUZE DERMACEA FLUFF 4 (GAUZE/BANDAGES/DRESSINGS) ×2 IMPLANT
COVER TIP SHEARS 8 DVNC (MISCELLANEOUS) ×2 IMPLANT
COVER TIP SHEARS 8MM DA VINCI (MISCELLANEOUS) ×2
COVER WAND RF STERILE (DRAPES) ×2 IMPLANT
DERMABOND ADVANCED .7 DNX12 (GAUZE/BANDAGES/DRESSINGS) ×2 IMPLANT
DRAPE ARM DVNC X/XI (DISPOSABLE) ×6 IMPLANT
DRAPE COLUMN DVNC XI (DISPOSABLE) ×2 IMPLANT
DRAPE DA VINCI XI ARM (DISPOSABLE) ×6
DRAPE DA VINCI XI COLUMN (DISPOSABLE) ×2
ELECT CAUTERY BLADE 6.4 (BLADE) IMPLANT
ELECT REM PT RETURN 9FT ADLT (ELECTROSURGICAL) ×2
ELECTRODE REM PT RTRN 9FT ADLT (ELECTROSURGICAL) ×2 IMPLANT
GLOVE BIOGEL PI IND STRL 7.0 (GLOVE) ×5 IMPLANT
GLOVE SURG SYN 6.5 ES PF (GLOVE) ×6 IMPLANT
GLOVE SURG SYN 6.5 PF PI (GLOVE) ×2 IMPLANT
GOWN STRL REUS W/ TWL LRG LVL3 (GOWN DISPOSABLE) ×6 IMPLANT
GOWN STRL REUS W/TWL LRG LVL3 (GOWN DISPOSABLE) ×6
IRRIGATOR SUCT 8 DISP DVNC XI (IRRIGATION / IRRIGATOR) IMPLANT
IRRIGATOR SUCTION 8MM XI DISP (IRRIGATION / IRRIGATOR)
IV NS 1000ML (IV SOLUTION)
IV NS 1000ML BAXH (IV SOLUTION) IMPLANT
LABEL OR SOLS (LABEL) ×1 IMPLANT
MANIFOLD NEPTUNE II (INSTRUMENTS) ×1 IMPLANT
MESH 3DMAX MID 4X6 RT LRG (Mesh General) ×1 IMPLANT
NDL INSUFFLATION 14GA 120MM (NEEDLE) ×1 IMPLANT
NEEDLE HYPO 22GX1.5 SAFETY (NEEDLE) ×2 IMPLANT
NEEDLE INSUFFLATION 14GA 120MM (NEEDLE) ×2 IMPLANT
OBTURATOR OPTICAL STANDARD 8MM (TROCAR) ×2
OBTURATOR OPTICAL STND 8 DVNC (TROCAR) ×2
OBTURATOR OPTICALSTD 8 DVNC (TROCAR) ×2 IMPLANT
PACK LAP CHOLECYSTECTOMY (MISCELLANEOUS) ×2 IMPLANT
PENCIL SMOKE EVACUATOR (MISCELLANEOUS) ×2 IMPLANT
SEAL CANN UNIV 5-8 DVNC XI (MISCELLANEOUS) ×6 IMPLANT
SEAL XI 5MM-8MM UNIVERSAL (MISCELLANEOUS) ×6
SET TUBE SMOKE EVAC HIGH FLOW (TUBING) ×2 IMPLANT
SOLUTION ELECTROLUBE (MISCELLANEOUS) ×2 IMPLANT
SUT MNCRL 4-0 (SUTURE) ×2
SUT MNCRL 4-0 27XMFL (SUTURE) ×2
SUT VIC AB 2-0 SH 27 (SUTURE) ×4
SUT VIC AB 2-0 SH 27XBRD (SUTURE) ×3 IMPLANT
SUT VLOC 90 6 CV-15 VIOLET (SUTURE) ×3 IMPLANT
SUTURE MNCRL 4-0 27XMF (SUTURE) ×2 IMPLANT
SYR 30ML LL (SYRINGE) ×2 IMPLANT
TAPE TRANSPORE STRL 2 31045 (GAUZE/BANDAGES/DRESSINGS) ×1 IMPLANT

## 2022-05-30 NOTE — Anesthesia Procedure Notes (Signed)
Procedure Name: Intubation Date/Time: 05/30/2022 7:43 AM  Performed by: Aline Brochure, CRNAPre-anesthesia Checklist: Patient identified, Patient being monitored, Timeout performed, Emergency Drugs available and Suction available Patient Re-evaluated:Patient Re-evaluated prior to induction Oxygen Delivery Method: Circle system utilized Preoxygenation: Pre-oxygenation with 100% oxygen Induction Type: IV induction Ventilation: Mask ventilation without difficulty and Oral airway inserted - appropriate to patient size Laryngoscope Size: McGraph and 4 Grade View: Grade I Tube type: Oral Tube size: 7.5 mm Number of attempts: 1 Airway Equipment and Method: Stylet and Video-laryngoscopy Placement Confirmation: ETT inserted through vocal cords under direct vision, positive ETCO2 and breath sounds checked- equal and bilateral Secured at: 22 cm Tube secured with: Tape Dental Injury: Teeth and Oropharynx as per pre-operative assessment

## 2022-05-30 NOTE — Discharge Instructions (Addendum)
Hernia repair, Care After This sheet gives you information about how to care for yourself after your procedure. Your health care provider may also give you more specific instructions. If you have problems or questions, contact your health care provider. What can I expect after the procedure? After your procedure, it is common to have the following: Pain in your abdomen, especially in the incision areas. You will be given medicine to control the pain. Tiredness. This is a normal part of the recovery process. Your energy level will return to normal over the next several weeks. Changes in your bowel movements, such as constipation or needing to go more often. Talk with your health care provider about how to manage this. Follow these instructions at home: Medicines  tylenol and alleve as needed for discomfort.  Please alternate between the two every four hours as needed for pain.    Use narcotics, if prescribed, only when tylenol and alleve is not enough to control pain.  325-'650mg'$  every 8hrs to max of '3000mg'$ /24hrs (including the '325mg'$  in every norco dose) for the tylenol.   PLEASE RECORD NUMBER OF PILLS TAKEN UNTIL NEXT FOLLOW UP APPT.  THIS WILL HELP DETERMINE HOW READY YOU ARE TO BE RELEASED FROM ANY ACTIVITY RESTRICTIONS Do not drive or use heavy machinery while taking prescription pain medicine. Do not drink alcohol while taking prescription pain medicine.  Incision care    Follow instructions from your health care provider about how to take care of your incision areas. Make sure you: Keep your incisions clean and dry. Wash your hands with soap and water before and after applying medicine to the areas, and before and after changing your bandage (dressing). If soap and water are not available, use hand sanitizer. Change your dressing as told by your health care provider. Leave stitches (sutures), skin glue, or adhesive strips in place. These skin closures may need to stay in place for 2 weeks  or longer. If adhesive strip edges start to loosen and curl up, you may trim the loose edges. Do not remove adhesive strips completely unless your health care provider tells you to do that. Do not wear tight clothing over the incisions. Tight clothing may rub and irritate the incision areas, which may cause the incisions to open. Do not take baths, swim, or use a hot tub until your health care provider approves. OK TO SHOWER IN 24HRS.   Check your incision area every day for signs of infection. Check for: More redness, swelling, or pain. More fluid or blood. Warmth. Pus or a bad smell. Activity Avoid lifting anything that is heavier than 10 lb (4.5 kg) for 2 weeks or until your health care provider says it is okay. No pushing/pulling greater than 30lbs You may resume normal activities as told by your health care provider. Ask your health care provider what activities are safe for you. Take rest breaks during the day as needed. Eating and drinking Follow instructions from your health care provider about what you can eat after surgery. To prevent or treat constipation while you are taking prescription pain medicine, your health care provider may recommend that you: Drink enough fluid to keep your urine clear or pale yellow. Take over-the-counter or prescription medicines. Eat foods that are high in fiber, such as fresh fruits and vegetables, whole grains, and beans. Limit foods that are high in fat and processed sugars, such as fried and sweet foods. General instructions Ask your health care provider when you will need an appointment  to get your sutures or staples removed. Keep all follow-up visits as told by your health care provider. This is important. Contact a health care provider if: You have more redness, swelling, or pain around your incisions. You have more fluid or blood coming from the incisions. Your incisions feel warm to the touch. You have pus or a bad smell coming from your  incisions or your dressing. You have a fever. You have an incision that breaks open (edges not staying together) after sutures or staples have been removed. You develop a rash. You have chest pain or difficulty breathing. You have pain or swelling in your legs. You feel light-headed or you faint. Your abdomen swells (becomes distended). You have nausea or vomiting. You have blood in your stool (feces). This information is not intended to replace advice given to you by your health care provider. Make sure you discuss any questions you have with your health care provider. Document Released: 03/22/2005 Document Revised: 05/22/2018 Document Reviewed: 06/03/2016 Elsevier Interactive Patient Education  2019 [place] BAND FOR 4 DAYS OR RECEIVE ANY TREATMENT THAT WOULD REQUIRE LOCAL ANESTHETIC   AMBULATORY SURGERY  DISCHARGE INSTRUCTIONS   The drugs that you were given will stay in your system until tomorrow so for the next 24 hours you should not:  Drive an automobile Make any legal decisions Drink any alcoholic beverage   You may resume regular meals tomorrow.  Today it is better to start with liquids and gradually work up to solid foods.  You may eat anything you prefer, but it is better to start with liquids, then soup and crackers, and gradually work up to solid foods.   Please notify your doctor immediately if you have any unusual bleeding, trouble breathing, redness and pain at the surgery site, drainage, fever, or pain not relieved by medication.    Additional Instructions:   Please contact your physician with any problems or Same Day Surgery at [phone], Monday through Friday 6 am to 4 pm, or [place] at [facility] number at [phone].

## 2022-05-30 NOTE — Interval H&P Note (Signed)
No change. Ok to proceed

## 2022-05-30 NOTE — Transfer of Care (Signed)
Immediate Anesthesia Transfer of Care Note  Patient: [name]  Procedure(s) Performed: XI ROBOTIC ASSISTED RIGHT INGUINAL HERNIA REPAIR WITH MESH (Right: Abdomen)  Patient Location: PACU  Anesthesia Type:General  Level of Consciousness: awake, alert  and oriented  Airway & Oxygen Therapy: Patient Spontanous Breathing and Patient connected to face mask oxygen  Post-op Assessment: Report given to RN and Post -op Vital signs reviewed and stable  Post vital signs: Reviewed and stable  Last Vitals:  Vitals Value Taken Time  BP 115/70   Temp    Pulse 85   Resp 12   SpO2 100     Last Pain:  Vitals:   05/30/22 0650  TempSrc: Temporal  PainSc: 0-No pain         Complications: No notable events documented.

## 2022-05-30 NOTE — Anesthesia Preprocedure Evaluation (Signed)
Anesthesia Evaluation  Patient identified by MRN, date of birth, ID band Patient awake    Reviewed: Allergy & Precautions, NPO status , Patient's Chart, lab work & pertinent test results  History of Anesthesia Complications (+) PONV and history of anesthetic complications  Airway Mallampati: IV  TM Distance: >3 FB Neck ROM: full    Dental  (+) Dental Advidsory Given, Teeth Intact   Pulmonary neg pulmonary ROS, neg shortness of breath, neg sleep apnea, neg COPD, former smoker,    Pulmonary exam normal        Cardiovascular (-) angina(-) Past MI and (-) CABG negative cardio ROS Normal cardiovascular exam     Neuro/Psych negative neurological ROS  negative psych ROS   GI/Hepatic negative GI ROS, Neg liver ROS,   Endo/Other  negative endocrine ROS  Renal/GU      Musculoskeletal   Abdominal   Peds  Hematology negative hematology ROS (+)   Anesthesia Other Findings Past Medical History: No date: Cancer ([place])     Comment:  skin No date: Complication of anesthesia No date: History of kidney stones No date: Kidney stone No date: Pneumonia No date: PONV (postoperative nausea and vomiting)  Past Surgical History: No date: COLONOSCOPY W/ POLYPECTOMY No date: CYST EXCISION 08/30/2021: EXTRACORPOREAL SHOCK WAVE LITHOTRIPSY; Right     Comment:  Procedure: EXTRACORPOREAL SHOCK WAVE LITHOTRIPSY (ESWL);              Surgeon: Abbie Sons, MD;  Location: [facility] ORS;                Service: Urology;  Laterality: Right; No date: TENDON REPAIR; Left     Comment:  big toe No date: VASECTOMY  BMI    Body Mass Index: 25.85 kg/m      Reproductive/Obstetrics negative OB ROS                             Anesthesia Physical Anesthesia Plan  ASA: 2  Anesthesia Plan: General ETT   Post-op Pain Management:    Induction: Intravenous  PONV Risk Score and Plan: Ondansetron, Dexamethasone,  Midazolam and TIVA  Airway Management Planned: Oral ETT  Additional Equipment:   Intra-op Plan:   Post-operative Plan: Extubation in OR  Informed Consent: I have reviewed the patients History and Physical, chart, labs and discussed the procedure including the risks, benefits and alternatives for the proposed anesthesia with the patient or authorized representative who has indicated his/her understanding and acceptance.     Dental Advisory Given  Plan Discussed with: Anesthesiologist, CRNA and Surgeon  Anesthesia Plan Comments: (Patient consented for risks of anesthesia including but not limited to:  - adverse reactions to medications - damage to eyes, teeth, lips or other oral mucosa - nerve damage due to positioning  - sore throat or hoarseness - Damage to heart, brain, nerves, lungs, other parts of body or loss of life  Patient voiced understanding.)        Anesthesia Quick Evaluation

## 2022-05-30 NOTE — Op Note (Signed)
Preoperative diagnosis: right, initial, reducible inguinal Hernia.  Postoperative diagnosis: righ inguinal Hernia  Procedure: Robotic assisted laparoscopic right inguinal hernia repair with mesh  Anesthesia: General  Surgeon: Dr. Lysle Pearl  Wound Classification: Clean  Specimen: none  Complications: None  Estimated Blood Loss: 28m   Indications:  inguinal hernia. Repair was indicated to avoid complications of incarceration, obstruction and pain, and a prosthetic mesh repair was elected.  See H&P for further details.  Findings: 1. Vas Deferens and cord structures identified and preserved 2. Bard 3D max medium weight mesh used for repair 3. Adequate hemostasis achieved  Description of procedure: The patient was taken to the operating room. A time-out was completed verifying correct patient, procedure, site, positioning, and implant(s) and/or special equipment prior to beginning this procedure.  Area was prepped and draped in the usual sterile fashion. An incision was marked 20 cm above the pubic tubercle, slightly above the umbilicus  Scrotum wrapped in Kerlix roll.  Veress needle inserted at palmer's point.  Saline drop test noted to be positive with gradual increase in pressure after initiation of gas insufflation.  15 mm of pressure was achieved prior to removing the Veress needle and then placing a 8 mm port via the Optiview technique through the supraumbilical site.  Inspection of the area afterwards noted no injury to the surrounding organs during insertion of the needle and the port.  2 port sites were marked 8 cm to the lateral sides of the initial port, and a 8 mm robotic port was placed on the left side, another 8 mm robotic port on the right side under direct supervision.  Local anesthesia  infused to the preplanned incision sites prior to insertion of the port.  The d[place] then brought into the operative field and docked to the ports.  Examination of the  abdominal cavity noted a right inguinal hernia.  A peritoneal flap was created approximately 8cm cephalad to the defect by using scissors with electrocautery.  Dissection was carried down towards the pubic tubercle, developing the myopectineal orifice view.  Laterally the flap was carried towards the ASIS.  Small hernia sac was noted, with small lipoma which carefully dissected away from the adjacent tissues to be fully reduced out of hernia cavity.  Any bleeding was controlled with combination of electrocautery and manual pressure.    After confirming adequate dissection and the peritoneal reflection completely down and away from the cord structures, a Large Bard 3DMax medium weight mesh was placed within the anterior abdominal wall, secured in place using 2-0 Vicryl on an SH needle immediately above the pubic tubercle.  After noting proper placement of the mesh with the peritoneal reflection deep to it, the previously created peritoneal flap was secured back up to the anterior abdominal wall using running 3-0 V-Lock.  Both needles were then removed out of the abdominal cavity, Xi platform undocked from the ports and removed off of operative field.  exparel infused as ilioinguinal block.  Abdomen then desufflated and ports removed. All the skin incisions were then closed with a subcuticular stitch of Monocryl 4-0. Dermabond was applied. The testis was gently pulled down into its anatomic position in the scrotum.  The patient tolerated the procedure well and was taken to the postanesthesia care unit in stable condition. Sponge and instrument count correct at end of procedure.

## 2022-05-30 NOTE — Anesthesia Postprocedure Evaluation (Signed)
Anesthesia Post Note  Patient: Gerald Chambers  Procedure(s) Performed: XI ROBOTIC ASSISTED RIGHT INGUINAL HERNIA REPAIR WITH MESH (Right: Abdomen)  Patient location during evaluation: PACU Anesthesia Type: General Level of consciousness: awake and alert Pain management: pain level controlled Vital Signs Assessment: post-procedure vital signs reviewed and stable Respiratory status: spontaneous breathing, nonlabored ventilation, respiratory function stable and patient connected to nasal cannula oxygen Cardiovascular status: blood pressure returned to baseline and stable Postop Assessment: no apparent nausea or vomiting Anesthetic complications: no   No notable events documented.   Last Vitals:  Vitals:   05/30/22 0945 05/30/22 0953  BP: 116/78 122/82  Pulse: 76 67  Resp: 16 18  Temp: 36.7 C 36.7 C  SpO2: 100% 100%    Last Pain:  Vitals:   05/30/22 0953  TempSrc: Temporal  PainSc: 0-No pain                 Dimas Millin

## 2022-05-31 ENCOUNTER — Encounter: Payer: Self-pay | Admitting: Surgery

## 2022-06-03 LAB — COLOGUARD: COLOGUARD: NEGATIVE

## 2022-06-03 LAB — EXTERNAL GENERIC LAB PROCEDURE: COLOGUARD: NEGATIVE

## 2022-07-11 ENCOUNTER — Encounter: Payer: Self-pay | Admitting: Surgery

## 2023-03-31 ENCOUNTER — Other Ambulatory Visit: Payer: Self-pay

## 2023-03-31 DIAGNOSIS — N200 Calculus of kidney: Secondary | ICD-10-CM

## 2023-04-01 ENCOUNTER — Ambulatory Visit
Admission: RE | Admit: 2023-04-01 | Discharge: 2023-04-01 | Disposition: A | Discharge status: Home / Self Care | Attending: Urology | Admitting: Urology

## 2023-04-01 ENCOUNTER — Ambulatory Visit
Admission: RE | Admit: 2023-04-01 | Discharge: 2023-04-01 | Disposition: A | Discharge status: Home / Self Care | Source: Ambulatory Visit | Attending: Urology | Admitting: Urology

## 2023-04-01 ENCOUNTER — Encounter: Payer: Self-pay | Admitting: Urology

## 2023-04-01 ENCOUNTER — Ambulatory Visit: Admitting: Urology

## 2023-04-01 VITALS — BP 134/77 | HR 79 | Ht 67.7 in | Wt 171.6 lb

## 2023-04-01 DIAGNOSIS — N200 Calculus of kidney: Secondary | ICD-10-CM | POA: Diagnosis present

## 2023-04-01 DIAGNOSIS — Z87442 Personal history of urinary calculi: Secondary | ICD-10-CM

## 2023-04-01 DIAGNOSIS — N138 Other obstructive and reflux uropathy: Secondary | ICD-10-CM

## 2023-04-01 DIAGNOSIS — N401 Enlarged prostate with lower urinary tract symptoms: Secondary | ICD-10-CM | POA: Diagnosis not present

## 2023-04-01 DIAGNOSIS — Z125 Encounter for screening for malignant neoplasm of prostate: Secondary | ICD-10-CM | POA: Diagnosis not present

## 2023-04-01 NOTE — Progress Notes (Signed)
   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  When we first met he was having 10 to 12 stone episodes per year.  A 24-hour urine in fall 2019 showed a low urine volume of 1.9 L, and low urine citrate of 366.  We started him on litholyte packets at that time for citrate supplementation, and he continues to use this daily.  These have significantly decreased his stone episodes to just 1 to 2-year.   He underwent successful right shockwave lithotripsy in December 2022 for a 7 mm right proximal ureteral stone.  He thinks he passed 2 small stones in the last 12 months.  I personally viewed and interpreted his KUB today that shows no definite radiopaque nephrolithiasis.  He has mild BPH symptoms which are well controlled on doxazosin that is prescribed by his PCP.  Urinary symptoms continue to be well controlled with only complaint occasional intermittency.  PSA screening has been normal, and most recent PSA was 0.92 in August 2023 which was stable from prior.  RTC 1 year KUB prior for stone surveillance Continue doxazosin and PSA screening through PCP   Sondra Come, MD  [facility] [address], [address] [place], [place] [address] [phone]

## 2024-03-31 ENCOUNTER — Ambulatory Visit (INDEPENDENT_AMBULATORY_CARE_PROVIDER_SITE_OTHER): Payer: Self-pay | Admitting: Urology

## 2024-03-31 ENCOUNTER — Ambulatory Visit
Admission: RE | Admit: 2024-03-31 | Discharge: 2024-03-31 | Disposition: A | Discharge status: Home / Self Care | Attending: Urology | Admitting: Urology

## 2024-03-31 ENCOUNTER — Ambulatory Visit
Admission: RE | Admit: 2024-03-31 | Discharge: 2024-03-31 | Disposition: A | Discharge status: Home / Self Care | Source: Ambulatory Visit | Attending: Urology | Admitting: Urology

## 2024-03-31 VITALS — BP 123/81 | HR 93 | Ht 68.0 in | Wt 175.0 lb

## 2024-03-31 DIAGNOSIS — N200 Calculus of kidney: Secondary | ICD-10-CM | POA: Diagnosis not present

## 2024-03-31 DIAGNOSIS — R399 Unspecified symptoms and signs involving the genitourinary system: Secondary | ICD-10-CM

## 2024-03-31 DIAGNOSIS — Z125 Encounter for screening for malignant neoplasm of prostate: Secondary | ICD-10-CM | POA: Diagnosis not present

## 2024-03-31 NOTE — Patient Instructions (Signed)

## 2024-03-31 NOTE — Progress Notes (Signed)
   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  When we first met he was having 10 to 12 stone episodes per year.  A 24-hour urine in 2019 showed a low urine volume of 1.9 L, and low urine citrate of 366.  We started him on litholyte packets at that time for citrate supplementation, and he continues to use this daily.  These have significantly decreased his stone episodes to just 1 to 2-year.   He underwent successful right shockwave lithotripsy in December 2022 for a 7 mm right proximal ureteral stone.  He thinks he may have passed 1 stone in the last year.  I personally viewed and interpreted his KUB today that shows possible small single nonobstructive stones bilaterally, no evidence of ureteral stones.  He has mild BPH symptoms which are well controlled on doxazosin that is prescribed by his PCP.  Urinary symptoms continue to be well controlled with only complaint occasional intermittency.  PSA screening has been normal, and most recent PSA was 1.23 in September 2024 which was stable from prior.  RTC 1 year KUB prior for stone surveillance Continue doxazosin and PSA screening through PCP   Gerald JAYSON Burnet, MD   [facility] [address], [address] [place], [place] [address] [phone]

## 2025-03-31 ENCOUNTER — Ambulatory Visit: Admitting: Urology
